# Patient Record
Sex: Male | Born: 1965 | Race: Black or African American | Hispanic: No | Marital: Single | Smoking: Former smoker
Health system: Southern US, Community
[De-identification: ages and names within clinical notes are randomized; demographics above are authoritative.]

## PROBLEM LIST (undated history)

## (undated) DIAGNOSIS — N529 Male erectile dysfunction, unspecified: Secondary | ICD-10-CM

## (undated) DIAGNOSIS — E785 Hyperlipidemia, unspecified: Secondary | ICD-10-CM

## (undated) HISTORY — DX: Hyperlipidemia, unspecified: E78.5

## (undated) HISTORY — DX: Male erectile dysfunction, unspecified: N52.9

## (undated) HISTORY — PX: PROSTATECTOMY: SHX69

---

## 1997-10-20 ENCOUNTER — Encounter: Admission: RE | Admit: 1997-10-20 | Discharge: 1997-10-20 | Payer: Self-pay | Admitting: Family Medicine

## 1997-10-23 ENCOUNTER — Encounter: Admission: RE | Admit: 1997-10-23 | Discharge: 1997-10-23 | Payer: Self-pay | Admitting: Family Medicine

## 2000-10-22 ENCOUNTER — Other Ambulatory Visit: Admission: RE | Admit: 2000-10-22 | Discharge: 2000-10-22 | Payer: Self-pay | Admitting: Family Medicine

## 2000-11-06 ENCOUNTER — Encounter: Payer: Self-pay | Admitting: Family Medicine

## 2000-11-06 ENCOUNTER — Encounter: Admission: RE | Admit: 2000-11-06 | Discharge: 2000-11-06 | Payer: Self-pay | Admitting: Family Medicine

## 2005-05-08 ENCOUNTER — Encounter: Admission: RE | Admit: 2005-05-08 | Discharge: 2005-05-08 | Payer: Self-pay | Admitting: Family Medicine

## 2010-06-07 ENCOUNTER — Encounter: Admission: RE | Admit: 2010-06-07 | Discharge: 2010-06-07 | Payer: Self-pay | Admitting: Family Medicine

## 2014-08-16 DIAGNOSIS — R972 Elevated prostate specific antigen [PSA]: Secondary | ICD-10-CM

## 2014-08-16 HISTORY — DX: Elevated prostate specific antigen (PSA): R97.20

## 2014-10-25 DIAGNOSIS — C61 Malignant neoplasm of prostate: Secondary | ICD-10-CM

## 2014-10-25 HISTORY — DX: Malignant neoplasm of prostate: C61

## 2015-07-30 DIAGNOSIS — N529 Male erectile dysfunction, unspecified: Secondary | ICD-10-CM

## 2015-07-30 HISTORY — DX: Male erectile dysfunction, unspecified: N52.9

## 2017-01-20 DIAGNOSIS — Z86010 Personal history of colonic polyps: Secondary | ICD-10-CM | POA: Diagnosis not present

## 2017-01-20 DIAGNOSIS — K648 Other hemorrhoids: Secondary | ICD-10-CM | POA: Diagnosis not present

## 2017-01-20 DIAGNOSIS — D126 Benign neoplasm of colon, unspecified: Secondary | ICD-10-CM | POA: Diagnosis not present

## 2017-03-10 DIAGNOSIS — H5203 Hypermetropia, bilateral: Secondary | ICD-10-CM | POA: Diagnosis not present

## 2017-06-19 DIAGNOSIS — N289 Disorder of kidney and ureter, unspecified: Secondary | ICD-10-CM | POA: Diagnosis not present

## 2017-06-19 DIAGNOSIS — E7800 Pure hypercholesterolemia, unspecified: Secondary | ICD-10-CM | POA: Diagnosis not present

## 2017-11-28 DIAGNOSIS — R079 Chest pain, unspecified: Secondary | ICD-10-CM | POA: Diagnosis not present

## 2018-03-11 DIAGNOSIS — M9907 Segmental and somatic dysfunction of upper extremity: Secondary | ICD-10-CM | POA: Diagnosis not present

## 2018-03-11 DIAGNOSIS — M9901 Segmental and somatic dysfunction of cervical region: Secondary | ICD-10-CM | POA: Diagnosis not present

## 2018-03-11 DIAGNOSIS — M9902 Segmental and somatic dysfunction of thoracic region: Secondary | ICD-10-CM | POA: Diagnosis not present

## 2018-04-12 DIAGNOSIS — M9902 Segmental and somatic dysfunction of thoracic region: Secondary | ICD-10-CM | POA: Diagnosis not present

## 2018-04-12 DIAGNOSIS — M9901 Segmental and somatic dysfunction of cervical region: Secondary | ICD-10-CM | POA: Diagnosis not present

## 2018-04-12 DIAGNOSIS — M9907 Segmental and somatic dysfunction of upper extremity: Secondary | ICD-10-CM | POA: Diagnosis not present

## 2018-06-21 DIAGNOSIS — Z8546 Personal history of malignant neoplasm of prostate: Secondary | ICD-10-CM | POA: Diagnosis not present

## 2018-06-21 DIAGNOSIS — N289 Disorder of kidney and ureter, unspecified: Secondary | ICD-10-CM | POA: Diagnosis not present

## 2018-06-21 DIAGNOSIS — E7800 Pure hypercholesterolemia, unspecified: Secondary | ICD-10-CM | POA: Diagnosis not present

## 2019-09-16 ENCOUNTER — Encounter: Payer: Self-pay | Admitting: Neurology

## 2019-10-24 ENCOUNTER — Encounter (HOSPITAL_COMMUNITY): Payer: Self-pay

## 2019-10-24 ENCOUNTER — Ambulatory Visit (HOSPITAL_COMMUNITY)
Admission: EM | Admit: 2019-10-24 | Discharge: 2019-10-24 | Disposition: A | Discharge status: Home / Self Care | Payer: 59

## 2019-10-24 ENCOUNTER — Other Ambulatory Visit: Payer: Self-pay

## 2019-10-24 DIAGNOSIS — K5900 Constipation, unspecified: Secondary | ICD-10-CM | POA: Diagnosis not present

## 2019-10-24 DIAGNOSIS — M79622 Pain in left upper arm: Secondary | ICD-10-CM

## 2019-10-24 MED ORDER — POLYETHYLENE GLYCOL 3350 17 GM/SCOOP PO POWD: 17.0000 g | Freq: Every day | ORAL | 0 refills | Status: DC

## 2019-10-24 NOTE — ED Triage Notes (Signed)
Pt is here with left lank pain that started 2 weeks ago, states urinate is dark colored. Pt has taken AZO to relieve discomfort.

## 2019-10-24 NOTE — Discharge Instructions (Addendum)
I do not believe this is you heart. This may be nerve irritation or related to gas trapping  Please try the miralax to ensure you are regularly moving your bowels   You may try over the counter lidocaine patches if having the pain more frequently. Try doing basic stretches, such as reaching over head.  If it becomes much more frequent, you have shortness of breath, chest pain in the middle of your chest or nausea and vomiting with this, please report to the emergency department.

## 2019-10-25 NOTE — ED Provider Notes (Signed)
[facility]    CSN: [id number] Arrival date & time: 10/24/19  1432      History   Chief Complaint Chief Complaint  Patient presents with  . Urinary Tract Infection    HPI Devon Romero is a 54 y.o. male.   Patient presents urgent care for evaluation of left-sided pain.  Symptoms have been present for 1 to 2 weeks.  He points to his upper flank/lower left-sided ribs.  He reports intermittent sharp pain in the left side that is occasionally radiated to the anterior aspect of his left ribs.  This is what made him concerned and prompted him to report to the urgent care.  He was concerned about whether this could be related to his heart.  He reports at most to have 4 instances of sharp pain that lasts 2 to 3 seconds.  He describes it as a" prick" sensation.  Reports occasionally taking a deep breath, position and movements can trigger the pain.  However it is not all that predictable when this will happen.  He denies that exertion creates this pain.  He does not become short of breath with the pain.  He has never become nauseous or diaphoretic with the pain.  He denies any irregular heartbeats or fast heart rate when this occurs.  He denies any skin rashes.  Denies painful urination, frequency urination or urgency.  Denies any blood in his urine.  Denies back pain.  Denies injury to the left side.  He has had no fevers, chills or body aches.  Patient does report occasional bloating on the left side and mild history of constipation.  He has tried some over the counter laxatives and seems to of improved him with his bloating symptoms.  Denies any rectal pain.  He reports his constipation can last up to 2 to 3 days at a time.  Patient does not have a history of high blood pressure but does have a history of high cholesterol and is on statin therapy.  No history of coronary artery disease.     History reviewed. No pertinent past medical history.  There are no problems to display  for this patient.   History reviewed. No pertinent surgical history.     Home Medications    Prior to Admission medications   Medication Sig Start Date End Date Taking? Authorizing Provider  atorvastatin (LIPITOR) 20 MG tablet  06/19/14  Yes [provider]  co-enzyme Q-10 30 MG capsule Take 30 mg by mouth 3 (three) times daily.   Yes [provider]  levofloxacin (LEVAQUIN) 500 MG tablet  11/22/14  Yes [provider]  oxyCODONE-acetaminophen (PERCOCET/ROXICET) 5-325 MG tablet  11/22/14  Yes [provider]  tadalafil (CIALIS) 20 MG tablet Take by mouth. 06/14/15  Yes [provider]  polyethylene glycol powder (GLYCOLAX/MIRALAX) 17 GM/SCOOP powder Take 17 g by mouth daily. 10/24/19   Bruna Dills, Marguerita Beards, PA-C    Family History Family History  Problem Relation Age of Onset  . Diabetes Mother     Social History Social History   Tobacco Use  . Smoking status: Never Smoker  . Smokeless tobacco: Never Used  Substance Use Topics  . Alcohol use: Not Currently  . Drug use: Never     Allergies   Patient has no known allergies.   Review of Systems Review of Systems  Per HPI Physical Exam Triage Vital Signs ED Triage Vitals  Enc Vitals Group     BP 10/24/19 1458  123/76     Pulse Rate 10/24/19 1458 (!) 58     Resp 10/24/19 1458 17     Temp 10/24/19 1458 98.2 F (36.8 C)     Temp Source 10/24/19 1458 Oral     SpO2 10/24/19 1458 100 %     Weight 10/24/19 1455 175 lb (79.4 kg)     Height --      Head Circumference --      Peak Flow --      Pain Score 10/24/19 1455 0     Pain Loc --      Pain Edu? --      Excl. in [place]? --    No data found.  Updated Vital Signs BP 123/76 (BP Location: Right Arm)   Pulse (!) 58   Temp 98.2 F (36.8 C) (Oral)   Resp 17   Wt 175 lb (79.4 kg)   SpO2 100%   Visual Acuity Right Eye Distance:   Left Eye Distance:   Bilateral Distance:    Right Eye Near:   Left Eye Near:    Bilateral  Near:     Physical Exam Vitals and nursing note reviewed.  Constitutional:      General: He is not in acute distress.    Appearance: Normal appearance. He is well-developed and normal weight. He is not ill-appearing or diaphoretic.  HENT:     Head: Normocephalic and atraumatic.  Eyes:     Conjunctiva/sclera: Conjunctivae normal.  Cardiovascular:     Rate and Rhythm: Normal rate and regular rhythm.     Heart sounds: No murmur.  Pulmonary:     Effort: Pulmonary effort is normal. No respiratory distress.     Breath sounds: Normal breath sounds. No wheezing, rhonchi or rales.     Comments: Pain reproduced with deep inspiration. Abdominal:     Palpations: Abdomen is soft.     Tenderness: There is no abdominal tenderness. There is no right CVA tenderness or left CVA tenderness.  Musculoskeletal:     Cervical back: Normal range of motion and neck supple.     Right lower leg: No edema.     Left lower leg: No edema.     Comments: No tenderness to palpation over the area of concern.  Unable to reproduce pain with range of motion of the shoulder and upper body.  Skin:    General: Skin is warm and dry.     Findings: No bruising, erythema or rash.  Neurological:     General: No focal deficit present.     Mental Status: He is alert and oriented to person, place, and time.      UC Treatments / Results  Labs (all labs ordered are listed, but only abnormal results are displayed) Labs Reviewed - No data to display  EKG   Radiology No results found.  Procedures Procedures (including critical care time)  Medications Ordered in UC Medications - No data to display  Initial Impression / Assessment and Plan / UC Course  I have reviewed the triage vital signs and the nursing notes.  Pertinent labs & imaging results that were available during my care of the patient were reviewed by me and considered in my medical decision making (see chart for details).     #Left axillary  pain #Constipation Patient is a 54 year old presenting with left axillary region pain.  Differential for this would include irritation of the long thoracic nerve vs costochondritis vs gas trapping secondary to constipation.  I doubt very seriously this is cardiac in nature as pain is very intermittent, sharp and not related to exertion.  Low suspicion that this is urologic or renal as pain is not in flank and is more in axillary region.  I discussed the likely causes of pain and we will treat his constipation with daily MiraLAX.  Discussed that given the intermittent and sporadic nature that oral medications may not be of long-term benefit.  Discussed that he may benefit from lidocaine patches and basic stretching.  We discussed that if pain became more frequent or intense to return urgent care.  Discussed that it became more prolonged there is accompanied by shortness of breath or radiation around the front of his chest that he should report to the emergency department.  Patient verbalized understanding the plan. Final Clinical Impressions(s) / UC Diagnoses   Final diagnoses:  Left axillary pain  Constipation, unspecified constipation type     Discharge Instructions     I do not believe this is you heart. This may be nerve irritation or related to gas trapping  Please try the miralax to ensure you are regularly moving your bowels   You may try over the counter lidocaine patches if having the pain more frequently. Try doing basic stretches, such as reaching over head.  If it becomes much more frequent, you have shortness of breath, chest pain in the middle of your chest or nausea and vomiting with this, please report to the emergency department.      ED Prescriptions    Medication Sig Dispense Auth. Provider   polyethylene glycol powder (GLYCOLAX/MIRALAX) 17 GM/SCOOP powder Take 17 g by mouth daily. 255 g Jazaria Jarecki, Marguerita Beards, PA-C     PDMP not reviewed this encounter.   Purnell Shoemaker,  PA-C 10/25/19 0800

## 2019-11-23 ENCOUNTER — Other Ambulatory Visit (INDEPENDENT_AMBULATORY_CARE_PROVIDER_SITE_OTHER): Payer: 59

## 2019-11-23 ENCOUNTER — Ambulatory Visit: Payer: 59 | Admitting: Neurology

## 2019-11-23 ENCOUNTER — Other Ambulatory Visit: Payer: Self-pay

## 2019-11-23 ENCOUNTER — Encounter: Payer: Self-pay | Admitting: Neurology

## 2019-11-23 VITALS — BP 123/83 | HR 71 | Resp 18 | Ht 68.0 in | Wt 182.0 lb

## 2019-11-23 DIAGNOSIS — R413 Other amnesia: Secondary | ICD-10-CM

## 2019-11-23 LAB — AMMONIA: Ammonia: 25 umol/L (ref 11–35)

## 2019-11-23 LAB — C-REACTIVE PROTEIN: CRP: <1 mg/dL (ref 0.5–20.0)

## 2019-11-23 LAB — SEDIMENTATION RATE: Sed Rate: 14 mm/hr (ref 0–20)
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
## 2019-11-23 NOTE — Patient Instructions (Addendum)
1. Bloodwork for RPR, ammonia, ESR, CRP, ANA, anti-thyroglobulin antibodies, anti-thyroid peroxidase antibodies  2. Schedule MRI brain with and without contrast  3. Schedule Neurocognitive testing  4. Follow-up after tests, call for any changes   RECOMMENDATIONS FOR ALL PATIENTS WITH MEMORY PROBLEMS: 1. Continue to exercise (Recommend 30 minutes of walking everyday, or 3 hours every week) 2. Increase social interactions - continue going to [place] and enjoy social gatherings with friends and family 3. Eat healthy, avoid fried foods and eat more fruits and vegetables 4. Maintain adequate blood pressure, blood sugar, and blood cholesterol level. Reducing the risk of stroke and cardiovascular disease also helps promoting better memory. 5. Avoid stressful situations. Live a simple life and avoid aggravations. Organize your time and prepare for the next day in anticipation. 6. Sleep well, avoid any interruptions of sleep and avoid any distractions in the bedroom that may interfere with adequate sleep quality 7. Avoid sugar, avoid sweets as there is a strong link between excessive sugar intake, diabetes, and cognitive impairment We discussed the Mediterranean diet, which has been shown to help patients reduce the risk of progressive memory disorders and reduces cardiovascular risk. This includes eating fish, eat fruits and green leafy vegetables, nuts like almonds and hazelnuts, walnuts, and also use olive oil. Avoid fast foods and fried foods as much as possible. Avoid sweets and sugar as sugar use has been linked to worsening of memory function.  We have sent a referral to [facility] for your MRI and they will call you directly to schedule your appointment. They are located at [place]. If you need to contact them directly please call [phone].  Your provider has requested that you have labwork completed today. Please go to [facility] ([id number]) on the second  floor of this building before leaving the office today. You do not need to check in. If you are not called within 15 minutes please check with the front desk.

## 2019-11-25 LAB — THYROGLOBULIN LEVEL: Thyroglobulin: 6.9 ng/mL

## 2019-11-25 LAB — RPR: RPR Ser Ql: NONREACTIVE

## 2019-11-25 LAB — THYROID PEROXIDASE ANTIBODY: Thyroperoxidase Ab SerPl-aCnc: <1 IU/mL (ref <9)

## 2019-11-26 LAB — ANTI-NUCLEAR AB-TITER (ANA TITER): ANA Titer 1: 1:80 {titer} — ABNORMAL HIGH

## 2019-11-26 LAB — ANA: Anti Nuclear Antibody (ANA): POSITIVE — AB

## 2019-11-29 ENCOUNTER — Telehealth: Payer: Self-pay

## 2019-11-29 NOTE — Telephone Encounter (Signed)
-----   Message from Cameron Sprang, MD sent at 11/29/2019  1:55 PM EDT ----- Pls let him know bloodwork overall unremarkable. His ANA level was a little elevated, but not specific for anything. Proceed with memory tests as scheduled. Thanks

## 2019-11-29 NOTE — Telephone Encounter (Signed)
Pt called no answer voice mail left for pt to call back 

## 2019-11-30 ENCOUNTER — Telehealth: Payer: Self-pay

## 2019-11-30 NOTE — Telephone Encounter (Signed)
-----   Message from Cameron Sprang, MD sent at 11/29/2019  1:55 PM EDT ----- Pls let him know bloodwork overall unremarkable. His ANA level was a little elevated, but not specific for anything. Proceed with memory tests as scheduled. Thanks

## 2019-11-30 NOTE — Telephone Encounter (Signed)
Spoke with pt informed him of lab results, pt will be in the office tomorrow for his memory testing ,

## 2019-12-01 ENCOUNTER — Encounter: Payer: Self-pay | Admitting: Counselor

## 2019-12-01 ENCOUNTER — Ambulatory Visit (INDEPENDENT_AMBULATORY_CARE_PROVIDER_SITE_OTHER): Payer: 59 | Admitting: Counselor

## 2019-12-01 ENCOUNTER — Other Ambulatory Visit: Payer: Self-pay

## 2019-12-01 ENCOUNTER — Ambulatory Visit: Payer: 59

## 2019-12-01 DIAGNOSIS — R413 Other amnesia: Secondary | ICD-10-CM

## 2019-12-01 DIAGNOSIS — F09 Unspecified mental disorder due to known physiological condition: Secondary | ICD-10-CM | POA: Diagnosis not present

## 2019-12-01 NOTE — Progress Notes (Signed)
   Psychometrist Note   Cognitive testing was administered to Devon Romero by Lamar Benes, B.S. (Technician) under the supervision of Alphonzo Severance, Psy.D., ABN. [name] was able to tolerate all test procedures. Dr. Nicole Kindred met with the patient as needed to manage any emotional reactions to the testing procedures (if applicable). Rest breaks were offered.    The battery of tests administered was selected by Dr. Nicole Kindred with consideration to the patient's current level of functioning, the nature of his symptoms, emotional and behavioral responses during the interview, level of literacy, observed level of motivation/effort, and the nature of the referral question. This battery was communicated to the psychometrist. Communication between Dr. Nicole Kindred and the psychometrist was ongoing throughout the evaluation and Dr. Nicole Kindred was immediately accessible at all times. Dr. Nicole Kindred provided supervision to the technician on the date of this service, to the extent necessary to assure the quality of all services provided.    [name] will return in approximately one week for an interactive feedback session with Dr. Nicole Kindred, at which time male test performance, clinical impressions, and treatment recommendations will be reviewed in detail. The patient understands he can contact our office should he require our assistance before this time.   A total of 140 minutes of billable time were spent with Devon Romero by the technician, including test administration and scoring time. Billing for these services is reflected in Dr. Les Pou note.   This note reflects time spent with the psychometrician and does not include test scores, clinical history, or any interpretations made by Dr. Nicole Kindred. The full report will follow in a separate note.

## 2019-12-01 NOTE — Progress Notes (Deleted)
NEUROPSYCHOLOGICAL EVALUATION [facility]  Patient Name: Devon Romero MRN: [id number] Date of Birth: 10/15/1965 Age: 54 y.o. Education: 9 years  Referral Circumstances and Background Information  Devon Romero is a 54 y.o., right-hand dominant, engaged man with a history of hyperlipidemia, prostate cancer, and memory loss. He was referred for neuropsychological testing by Dr. Karel Jarvis, who demonstrated a SLUMS of 11/30.   On interview, the patient himself reported that he has always had a keen memory, and he feels as though he has been having a harder time with memory since about 2018. The company he was working for closed in 2016, he was out of work until 2018, and then he had a hard time learning a new job in 2018, he was at [employer]. He stated that he was repeating himself and asking the same things over again. He previously reported that his coworkers noticed his problems but he is denying that today. He said he was still able to function at his job. He was furloughed in May, 2020 and hasn't been back to work since. His fiance said that there is rapid forgetting of information, at this point in time, over the past year. He forgets that he has scheduled appointments. He doesn't forget entire events.  The patient says he thinks his problems have gotten better over the past month but his fiancee thinks they are worse. She notices some problems keeping track of the specific date but he doesn't have problems keeping track of the month or the year. With respect to sleep, the patient has a disturbed sleep schedule, he often stays up until early in the morning and then sleeps until the early afternoon. The patient says he thinks he is getting a decent 8 hours of sleep but his fiance does not and thinks that sometimes, he is only getting a few hours. He denied any changes with appetite. He stated that his energy is fine.   With respect to functioning, his fiance said that he has had  some problems apart from work. 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 He also got turned around in a neighborhood with which he was familiar about a year ago. She said that he is forgetting passwords for his computer. The patient does some basic cooking and stated that he is doing fine with cooking. He left the burner on the stove on once but it was an isolated incident. He is able to use the community as needed to get groceries and things of that nature but he needs a list. He was apparently functioning adequately at his job until he was furloughed, although his fiance doesn't think that he could do a job now. He has not considered applying for disability because he would like to go back to work after his memory problems are treated.   Past Medical History and Review of Relevant Studies  There are no problems to display for this patient.  Review of Neuroimaging and Relevant Medical History: :  MRI of the brain has been ordered but has not yet been obtained  Nonfocal neurological exam with Dr. Karel Jarvis 11/23/2019.  Patient has a history of head injury around age 37, he was tubing, and he was admitted to the hospital for one night for observation. He stated that he made a full recovery cognitively.    Current Outpatient Medications  Medication Sig Dispense Refill  .  atorvastatin (LIPITOR) 40 MG tablet Take 40 mg by mouth daily.    Marland Kitchen co-enzyme Q-10 30 MG capsule Take 30 mg by mouth 3 (three) times daily.    . tadalafil (CIALIS) 20 MG tablet Take by mouth.     No current facility-administered medications for this visit.    Family History  Problem Relation Age of Onset  . Diabetes Mother    There is no  family history of dementia. The patient has two brothers and four sisters who are all over 68 and he denied they have any memory and thinking problems. His parents are in their early 9s and have no memory and thinking  problems. There is no  family history of psychiatric illness.  Psychosocial History  Developmental, Educational and Employment History: The patient is a native of [place] and has lived in [place] since 1996. The patient stated that he was a "decent" student, but on closer discussion, he was held back one year and failed classes occasionally. He said he did well in elementary school and had a harder time when he went to junior high. He said the junior high school that he went to was across town, white, and the classes were really challenging. He wasn't clear if he had any specific problems in different areas but he is not good at spelling. He left in the 10th grade, partly because he had fallen behind. He also had a child on the way. 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 He said that it did involve a fair amount of math and knowledge of the machinery.   Psychiatric History: Denied by the patient.   Substance Use History: The patient used to drink excessively although he stopped in 1992. He smoked for many years but quit about 10 years ago. He doesn't use cannabis or illicit drugs.   Relationship History and Living Cimcumstances: The patient and his fiance Devon Romero have been together 30 plus years. They are engaged. They have no children, the patient has a child from a previous relationship. She lives in [place] and he talks to her frequently. His fiance said that his daughter has noticed his memory and thinking problems, as has his mother.   Mental Status and Behavioral Observations  Sensorium/Arousal: The patient's level of arousal was awake and alert. Hearing and vision were adequate for testing purposes. Orientation: The patient was alert and oriented to person, place, time, and situation.  Appearance: The  patient was casually dressed in appropriate, casual clothing with good grooming and hygiene.  Behavior: The patient was pleasant and appropriate. In general, he seemed to portray his difficulties as less severe than his fiancee. He appeared to outwardly be putting forth good effort and to be actively engaged by the assessment tasks.  Speech/language: Speech was normal in rate, rhythm, volume, and prosody. There were no word finding pauses or paraphasic errors.  Gait/Posture: Gait was not well observed.  Movement: There were no overt signs/symptoms of movement disorder such as bradykinesia, hypokinesia, or adventitious movements. Social Comportment: Pleasant, appropriate Mood: "Good"  Affect: Euthymic Thought process/content: The patient's thought process was logical, linear, and goal oriented for the most part. Thought content was appropriate to the topics discussed.  Safety: The patient denied any thoughts of harming himself or others.  Insight: Unclear, at least mildly diminished  Test Procedures  Wide Range Achievement Test - 4   Word Reading Wechsler Adult Intelligence Scale - IV  Digit Span  Arithmetic  Symbol Search  Coding Repeatable Battery for the Assessment of Neuropsychological Status (Form A) ACS Word Choice The Dot Counting Test Green's MSVT Controlled Oral Word Association (F-A-S) Semantic Fluency (Animals) Trail Making Test A & B Wisconsin Card Sorting Test [phone] Patient Health Questionnaire - 9  GAD-7  Plan  Devon Romero was seen for a psychiatric diagnostic evaluation and neuropsychological testing. He has noticed memory problems over the past 3 years and his fiancee has become more concerned over the past year. He was still working until May, 2020 when he was furloughed and has had more problems since then. His fiancee reports that he is now rapidly forgetting things and he has gotten lost a few times. MRI of the brain is pending. He is screening in the mild  dementia range, although potential validity issues and other factors will be better evaluated by neuropsychological testing and he may have a learning issue given his clinical history. Full and complete note with impressions, recommendations, and interpretation of test data to follow.   Bettye Boeck Roseanne Reno, PsyD, ABN Clinical Neuropsychologist  Informed Consent and Coding/Compliance  Risks and benefits of the evaluation were discussed with the patient prior to all testing procedures. I conducted a clinical interview {psqhptesting:23835} with Ricke Hey and {pstechnician:23646} assisted me in administering additional test procedures. The patient was able to tolerate the testing procedures and the patient (and/or family if applicable) is likely to benefit from further follow up to receive the diagnosis and treatment recommendations, which will be rendered at the next encounter. Billing below reflects technician time, my direct face-to-face time with the patient, time spent in test administration, and time spent in professional activities including but not limited to: neuropsychological test interpretation, integration of neuropsychological test data with clinical history, report preparation, treatment planning, care coordination, and review of diagnostically pertinent medical history or studies.   Services associated with this encounter: Clinical Interview [phone]) plus 60 minutes (45409; Neuropsychological Evaluation by Professional)  120 minutes (81191; Neuropsychological Evaluation by Professional, Adl.) 30 minutes (47829; Test Administration by Professional) 110 minutes [phone]; Test Administration by Professional, Adl.) *** minutes (08657; Neuropsychological Testing by Technician) *** minutes (84696; Neuropsychological Testing by Technician, Adl.)

## 2019-12-02 ENCOUNTER — Ambulatory Visit
Admission: RE | Admit: 2019-12-02 | Discharge: 2019-12-02 | Disposition: A | Discharge status: Home / Self Care | Payer: 59 | Source: Ambulatory Visit | Attending: Neurology | Admitting: Neurology

## 2019-12-02 DIAGNOSIS — R413 Other amnesia: Secondary | ICD-10-CM

## 2019-12-02 MED ORDER — GADOBENATE DIMEGLUMINE 529 MG/ML IV SOLN
16.0000 mL | Freq: Once | INTRAVENOUS | Status: AC | PRN
  Administered 2019-12-02: 16 mL via INTRAVENOUS

## 2019-12-02 NOTE — Progress Notes (Signed)
NEUROPSYCHOLOGICAL EVALUATION [facility]  Patient Name: Devon Romero MRN: [id number] Date of Birth: 02-Dec-1965 Age: 54 y.o. Education: 9 years  Referral Circumstances and Background Information  Mr. Dilullo is a 54 y.o., right-hand dominant, engaged man with a history of hyperlipidemia, prostate cancer, and memory loss. He was referred for neuropsychological testing by Dr. Karel Jarvis, who demonstrated a SLUMS of 11/30 and a non-focal exam.   On interview, the patient himself reported that he has always had a keen memory, and he feels as though he has been having a harder time with memory since about 2018. The company he was working for closed in 2016, he was out of work until 2018, and then he had a hard time learning a new job in 2018, he was at [employer]. He stated that he was repeating himself and asking the same things over again. He previously reported that his coworkers noticed his problems but he is denying that today. He said he was still able to function at his job. He was furloughed in May, 2020 and hasn't been back to work since. His fiance said that there is rapid forgetting of information, at this point in time, over the past year. He forgets that he has scheduled appointments. He doesn't forget entire events.  The patient says he thinks his problems have gotten better over the past month but his fiancee thinks they are worse. She notices some problems keeping track of the specific date but he doesn't have problems keeping track of the month or the year. With respect to sleep, the patient has a disturbed sleep schedule, he often stays up until early in the morning and then sleeps until the early afternoon. The patient says he thinks he is getting a decent 8 hours of sleep but his fiance does not and thinks that sometimes, he is only getting a few hours. He denied any changes with appetite. He stated that his energy is fine.   With respect to functioning, his fiance said that  he has had some problems apart from work. 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 He also got turned around in a neighborhood with which he was familiar about a year ago. She said that he is forgetting passwords for his computer. The patient does some basic cooking and stated that he is doing fine with cooking. He left the burner on the stove on once but it was an isolated incident. He is able to use the community as needed to get groceries and things of that nature but he needs a list. He was apparently functioning adequately at his job until he was furloughed, although his fiance doesn't think that he could do a job now. He has not considered applying for disability because he would like to go back to work after his memory problems are treated.   Past Medical History and Review of Relevant Studies  There are no problems to display for this patient.  Review of Neuroimaging and Relevant Medical History: :  MRI of the brain has been ordered but has not yet been obtained  Nonfocal neurological exam with Dr. Karel Jarvis 11/23/2019.  Patient has a history of head injury around age 57, he was tubing, and he was admitted to the hospital for one night for observation. He stated that he made a full recovery cognitively.    Current Outpatient Medications  Medication Sig Dispense Refill  .  atorvastatin (LIPITOR) 40 MG tablet Take 40 mg by mouth daily.    Marland Kitchen co-enzyme Q-10 30 MG capsule Take 30 mg by mouth 3 (three) times daily.    . tadalafil (CIALIS) 20 MG tablet Take by mouth.     No current facility-administered medications for this visit.    Family History  Problem Relation Age of Onset  . Diabetes Mother    There is no  family history of dementia. The patient has two brothers and four sisters who are all over 65 and he denied they have any memory and thinking problems. His parents are in their early 45s and have no memory and  thinking problems. There is no  family history of psychiatric illness.  Psychosocial History  Developmental, Educational and Employment History: The patient is a native of [place] and has lived in [place] since 1996. The patient stated that he was a "decent" student, but on closer discussion, he was held back one year and failed classes occasionally. He said he did well in elementary school and had a harder time when he went to junior high. He said the junior high school that he went to was across town, white, and the classes were really challenging. He wasn't clear if he had any specific problems in different areas but he is not good at spelling. He left in the 10th grade, partly because he had fallen behind. He also had a child on the way. 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 He said that it did involve a fair amount of math and knowledge of the machinery.   Psychiatric History: Denied by the patient.   Substance Use History: The patient used to drink excessively although he stopped in 1992. He smoked for many years but quit about 10 years ago. He doesn't use cannabis or illicit drugs.   Relationship History and Living Cimcumstances: The patient and his fiance [name] have been together 30 plus years. They are engaged. They have no children, the patient has a child from a previous relationship. She lives in [place] and he talks to her frequently. His fiance said that his daughter has noticed his memory and thinking problems, as has his mother.   Mental Status and Behavioral Observations  Sensorium/Arousal: The patient's level of arousal was awake and alert. Hearing and vision were adequate for testing purposes. Orientation: The patient was alert and oriented to person, place, time, and situation.  Appearance:  The patient was casually dressed in appropriate, casual clothing with good grooming and hygiene.  Behavior: The patient was pleasant and appropriate. In general, he seemed to portray his difficulties as less severe than his fiancee. He appeared to outwardly be putting forth good effort and to be actively engaged by the assessment tasks.  Speech/language: Speech was normal in rate, rhythm, volume, and prosody. There were no word finding pauses or paraphasic errors.  Gait/Posture: Gait was not well observed.  Movement: There were no overt signs/symptoms of movement disorder such as bradykinesia, hypokinesia, or adventitious movements. Social Comportment: Pleasant, appropriate Mood: "Good"  Affect: Euthymic Thought process/content: The patient's thought process was logical, linear, and goal oriented for the most part. Thought content was appropriate to the topics discussed.  Safety: The patient denied any thoughts of harming himself or others.  Insight: Unclear, at least mildly diminished  MMSE - Mini Mental State Exam 12/02/2019  Orientation to time 5  Orientation to Place 5  Registration 3  Attention/ Calculation 4  Recall 0  Language- name 2 objects 2  Language- repeat 0  Language- follow 3 step command 3  Language- read & follow direction 1  Write a sentence 0  Copy design 1  Total score 24   Test Procedures  Wide Range Achievement Test - 4   Word Reading Wechsler Adult Intelligence Scale - IV  Digit Span  Arithmetic  Symbol Search  Coding Repeatable Battery for the Assessment of Neuropsychological Status (Form A) ACS Word Choice The Dot Counting Test Green's MSVT Controlled Oral Word Association (F-A-S) Semantic Fluency (Animals) Trail Making Test A & B Wisconsin Card Sorting Test [phone] Patient Health Questionnaire - 9  GAD-7  Plan  BENI KUBAS was seen for a psychiatric diagnostic evaluation and neuropsychological testing. He has noticed memory problems over  the past 3 years and his fiancee has become more concerned over the past year. He was still working until May, 2020 when he was furloughed and has had more problems since then. His fiancee reports that he is now rapidly forgetting things and he has gotten lost a few times. MRI of the brain is pending. He is screening in the mild dementia range, although potential validity issues and other factors will be better evaluated by neuropsychological testing and he may have a learning issue given his clinical history. Full and complete note with impressions, recommendations, and interpretation of test data to follow.   Bettye Boeck Roseanne Reno, PsyD, ABN Clinical Neuropsychologist  Informed Consent and Coding/Compliance  Risks and benefits of the evaluation were discussed with the patient prior to all testing procedures. I conducted a clinical interview and neuropsychological testing (at least two tests) with Ricke Hey and Clare Charon, B.S. (Technician) assisted me in administering additional test procedures. The patient was able to tolerate the testing procedures and the patient (and/or family if applicable) is likely to benefit from further follow up to receive the diagnosis and treatment recommendations, which will be rendered at the next encounter. Billing below reflects technician time, my direct face-to-face time with the patient, time spent in test administration, and time spent in professional activities including but not limited to: neuropsychological test interpretation, integration of neuropsychological test data with clinical history, report preparation, treatment planning, care coordination, and review of diagnostically pertinent medical history or studies.   Services associated with this encounter: Clinical Interview [phone]) plus 60 minutes (45409; Neuropsychological Evaluation by Professional)  120 minutes (81191; Neuropsychological Evaluation by Professional, Adl.) 30 minutes (47829;  Test Administration by Professional) 30 minutes (56213; Neuropsychological Testing by Technician) 110 minutes (08657; Neuropsychological Testing by Technician, Adl.)

## 2019-12-06 ENCOUNTER — Telehealth: Payer: Self-pay

## 2019-12-06 ENCOUNTER — Telehealth: Payer: Self-pay | Admitting: Neurology

## 2019-12-06 NOTE — Telephone Encounter (Signed)
Pt called and informed that MRI brain did not show any evidence of tumor, stroke, or bleed. It showed mild age-related changes. Proceed with Neurocognitive testing. Pt verbalized understanding

## 2019-12-06 NOTE — Telephone Encounter (Signed)
-----   Message from Cameron Sprang, MD sent at 12/06/2019 10:27 AM EDT ----- Pls let patient know MRI brain did not show any evidence of tumor, stroke, or bleed. It showed mild age-related changes. Proceed with Neurocognitive testing, thanks

## 2019-12-06 NOTE — Telephone Encounter (Signed)
Pt called back informed that we can not text results to him, and reminded that he was called earlier about MRI results not lab results pt verbalized understanding

## 2019-12-06 NOTE — Telephone Encounter (Signed)
Patient left message with [employer]:  "Caller states he is returning a call about his lab results he received this morning. He is requesting that his lab results be texted to him."

## 2019-12-07 NOTE — Progress Notes (Signed)
NEUROPSYCHOLOGICAL TEST SCORES [facility]  Patient Name: Devon Romero MRN: [id number] Date of Birth: [birth year] Age: 54 y.o. Education: 9 years  Measurement properties of test scores: IQ, Index, and Standard Scores (SS): Mean = 100; Standard Deviation = 15 Scaled Scores (Ss): Mean = 10; Standard Deviation = 3 Z scores (Z): Mean = 0; Standard Deviation = 1 T scores (T); Mean = 50; Standard Deviation = 10  TEST SCORES:    Note: This summary of test scores accompanies the interpretive report and should not be interpreted by unqualified individuals or in isolation without reference to the report. Test scores are relative to age, gender, and educational history as available and appropriate.   Performance Validity        ACS: Raw Descriptor      Word Choice: 35 Below Expectation      MSVT: Raw Descriptor      Immediate Recall 75 Below Expectation      Delayed Recall 85 Below Expectation      Consistency 60 Below Expectation      Paired Associates 60 Below Expectation      Free Recall 20 Below Expectation      The Dot Counting Test: Raw Descriptor      E-Score 14 Within Expectation      Embedded Measures: Raw Descriptor      RBANS Effort Index: 4 Below Expectation      WAIS-IV Reliable Digit Span: 10 Within Expectation      WAIS-IV Reliable Digit Span Revised 14 Within Expectation      Expected Functioning        Wide Range Achievement Test (Word Reading): Standard/Scaled Score Percentile       Word Reading 76 5      Cognitive Testing        RBANS, Form : Standard/Scaled Score Percentile  Total Score 61 <1  Immediate Memory 57 <1      List Learning 4 2      Story Memory 2 <1  Visuospatial/Constructional 69 2      Figure Copy   (15) 5 5      Line Orientation --- 3-9  Language 90 25      Picture Naming --- 51-75      Semantic Fluency 7 16  Attention 88 21      Digit Span 12 75      Coding 4 2  Delayed Memory 40 <1      List Recall   (0) --- <2       List Recognition   (12) --- <2      Story Recall   (0) 1 <1      Figure Recall   (0) 1 <1      Wechsler Adult Intelligence Scale - IV: Standard/Scaled Score Percentile  Working Memory Index 92 30      Digit Span 10 50          Digit Span Forward 12 75          Digit Span Backward 10 50          Digit Span Sequencing 7 16      Arithmetic 7 16  Processing Speed Index 65 1      Symbol Search 3 1      Coding 4 2      Neuropsychological Assessment Battery (Language Module): T-score Percentile      Naming 55 69      Verbal Fluency: T-score  Percentile      Controlled Oral Word Association (F-A-S) 58 79      Semantic Fluency (Animals) 50 50      Trail Making Test: T-Score Percentile      Part A 37 9      Part B 43 25      Modified [place] Card Sorting Test (MWCST): Standard/T-Score Percentile      Number of Categories Correct 31 3      Number of Perseverative Errors 28 2      Number of Total Errors 26 1      Percent Perseverative Errors 39 14  Executive Function Composite 66 2      Boston Diagnostic Aphasia Exam: Raw Score Scaled Score      Complex Ideational Material 11 9      Clock Drawing Raw Score Descriptor      Command 7 Mild Impairment      Rating Scales         Raw Score Descriptor  Patient Health Questionnaire - 9 5 Mild Depression  GAD-7 10 Moderate Anxiety  Quick Dementia Rating System        Sum of Boxes 5 Mild Dementia      Total Score 6.5 Mild Dementia    Reni Hausner V. Roseanne Reno PsyD, ABN Clinical Neuropsychologist

## 2019-12-08 ENCOUNTER — Ambulatory Visit (INDEPENDENT_AMBULATORY_CARE_PROVIDER_SITE_OTHER): Payer: 59 | Admitting: Counselor

## 2019-12-08 ENCOUNTER — Encounter: Payer: Self-pay | Admitting: Counselor

## 2019-12-08 ENCOUNTER — Other Ambulatory Visit: Payer: Self-pay

## 2019-12-08 DIAGNOSIS — F0280 Dementia in other diseases classified elsewhere without behavioral disturbance: Secondary | ICD-10-CM | POA: Diagnosis not present

## 2019-12-08 DIAGNOSIS — G300 Alzheimer's disease with early onset: Secondary | ICD-10-CM | POA: Diagnosis not present

## 2019-12-08 HISTORY — DX: Dementia in other diseases classified elsewhere, unspecified severity, without behavioral disturbance, psychotic disturbance, mood disturbance, and anxiety: F02.80

## 2019-12-08 NOTE — Patient Instructions (Signed)
Your performance and presentation on neuropsychological assessment were consistent with memory storage problems, diminished executive abilities, and some additional difficulties with visuospatial and constructional functioning (I.e., perceiving things clearly and constructing things, such as drawings). In the context of your clinical history and wife's report of your functioning, I think these findings are highly concerning for a dementia. I also believe there was more shrinkage than we would expect in parts of your brain responsible for memory, which is supportive of that impression.   Dementia refers to a group of syndromes where multiple areas of ability are damaged in the brain, such as memory, thinking, judgment, and behavior, and most commonly refers to age related causes of dementia that cause worsening in these abilities over time. Alzheimer's disease is the most common form of dementia in people over the age of 40. Not all dementias are Alzheimer's disease, but all Alzheimer's disease is dementia. When dementia is due to an underlying condition affecting the brain, such as Alzheimer's disease, there is progression over time, which typically procedes gradually over many years.   In your case, your presentation, neuroimaging, and neuropsychological test data are concerning for early onset Alzheimer's disease. This is a rare condition in young individuals and as such, it would not be unreasonable to pursue further biomarker confirmation.    CSF biomarkers for Alzheimer's disease involve taking a sample of cerebrospinal fluid through a lumbar puncture (what is also colloquially called a "spinal tap"). This study can detect if there are proteins in your CSF that have been shown to be predictive of Alzheimer's disease. If that study were positive, it would greatly enhance our confidence that this is Alzheimer's disease. If those findings are negative, it would not rule out the possibility of dementia but  would mean that something other than Alzheimer's disease is likely the cause of your problem. If your difficulties are due to Alzheimer's disease, they will worsen over time.   I think that your test findings are supportive of disability status and within a reasonable degree of neuropsychological certainty, I think you would have a hard time attaining and maintaining gainful employment at the present. You may wish to file for disability.   There are medications that can be helpful for memory loss, which you can discuss with Dr. Delice Lesch.   It is unclear from your neuropsychological test data if you are unsafe to drive at the present time. I would encourage you to carefully monitor your driving, because you have gotten lost on several occasions. I would encourage you to use a GPS, drive with family members, and I think it is inadvisable for you to drive very long distances (such as interstate driving) and you may also want to avoid driving in unfamiliar areas.   There is now good quality evidence from at least one large scale study that a modified mediterranean diet may help slow cognitive decline. This is known as the "MIND" diet. The Mind diet is not so much a specific diet as it is a set of recommendations for things that you should and should not eat.   Foods that are ENCOURAGED on the MIND Diet:  Green, leafy vegetables: Aim for six or more servings per week. This includes kale, spinach, cooked greens and salads.  All other vegetables: Try to eat another vegetable in addition to the green leafy vegetables at least once a day. It is best to choose non-starchy vegetables because they have a lot of nutrients with a low number of calories.  Berries: Eat berries at least twice a week. There is a plethora of research on strawberries, and other berries such as blueberries, raspberries and blackberries have also been found to have antioxidant and brain health benefits.  Nuts: Try to get five servings of  nuts or more each week. The creators of the [place] don't specify what kind of nuts to consume, but it is probably best to vary the type of nuts you eat to obtain a variety of nutrients. Peanuts are a legume and do not fall into this category.  Olive oil: Use olive oil as your main cooking oil. There may be other heart-healthy alternatives such as algae oil, though there is not yet sufficient research upon which to base a formal recommendation.  Whole grains: Aim for at least three servings daily. Choose minimally processed grains like oatmeal, quinoa, brown rice, whole-wheat pasta and 100% whole-wheat bread.  Fish: Eat fish at least once a week. It is best to choose fatty fish like salmon, sardines, trout, tuna and mackerel for their high amounts of omega-3 fatty acids.  Beans: Include beans in at least four meals every week. This includes all beans, lentils and soybeans.  Poultry: Try to eat chicken or [place] at least twice a week. Note that fried chicken is not encouraged on the MIND diet.  Wine: Aim for no more than one glass of alcohol daily. Both red and white wine may benefit the brain. However, much research has focused on the red wine compound resveratrol, which may help protect against Alzheimer's disease.  Foods that are DISCOURAGED on the MIND Diet: Butter and margarine: Try to eat less than 1 tablespoon (about 14 grams) daily. Instead, try using olive oil as your primary cooking fat, and dipping your bread in olive oil with herbs.  Cheese: The MIND diet recommends limiting your cheese consumption to less than once per week.  Red meat: Aim for no more than three servings each week. This includes all beef, pork, lamb and products made from these meats.  [place] food: The MIND diet highly discourages fried food, especially the kind from fast-food restaurants. Limit your consumption to less than once per week.  Pastries and sweets: This includes most of the processed junk food and desserts you  can think of. Ice cream, cookies, brownies, snack cakes, donuts, candy and more. Try to limit these to no more than four times a week.  Exercise is one of the best medicines for promoting health and maintaining cognitive fitness at all stages in life. Exercise probably has the largest documented effect on brain health and performance of any intervention. Studies have shown that even previously sedentary individuals who start exercising as late as age 4 show a significant survival benefit as compared to their non-exercising peers. In the [place], the current guidelines are for 30 minutes of moderate exercise per day, but increasing your activity level less than that may also be helpful. You do not have to get your 30 minutes of exercise in one shot and exercising for short periods of time spread throughout the day can be helpful. Go for several walks, learn to dance, or do something else you enjoy that gets your body moving. Of course, if you have an underlying medical condition or there is any question about whether it is safe for you to exercise, you should consult a medical treatment provider prior to beginning exercise.

## 2019-12-08 NOTE — Progress Notes (Signed)
NEUROPSYCHOLOGICAL EVALUATION [facility]  Patient Name: Devon Romero MRN: [id number] Date of Birth: 12/29/1965 Age: 54 y.o. Education: 9 years  Clinical Impressions  Devon Romero is a 54 y.o., right-hand dominant, engaged man with a history of hyperlipidemia, prostate cancer, and memory loss over the past 2 to 3 years. He has been out of work since May, 2020, and his partner does not think he could go back. They have noticed forgetfulness, difficulties tracking appointments, and some minor difficulties with orientation. He recently had an MRI since his past appointment, which to my eye shows fairly significant volume loss in the mesial temporal areas (particularly toward the head of the hippocampus) considering his relatively young age.   Neuropsychological test findings are concerning for significant cognitive impairment including memory storage problems, some level of executive dysfunction, and visuospatial/constructional problems. His difficulty interacting with visual test stimuli likely undermined his performance on processing speed tasks.  He performed adequately on measures of verbal fluency and working memory. He was rated as functioning at a mild dementia level by his partner, [name]. He screened positive for the presence of mild depression and moderate anxiety symptoms although he presented as largely euthymic and denied much in the way of problems on interview so that may reflect difficulties interacting with the test forms as opposed to a bona-fide psychiatric condition.   The presentation is highly concerning for early onset dementia, which would most likely be due to Alzheimer's disease given his presentation and test performance. The findings are supportive of disability status. Given his young age, confirmation with biomarkers would be reasonable but may not be needed as his imaging is also suggestive.   Diagnostic Impressions: Early onset Alzheimer's  disease  Recommendations to be discussed with patient  Your performance and presentation on neuropsychological assessment were consistent with memory storage problems, diminished executive abilities, and some additional difficulties with visuospatial and constructional functioning (I.e., perceiving things clearly and constructing things, such as drawings). In the context of your clinical history and wife's report of your functioning, I think these findings are highly concerning for a dementia. I also believe there was more shrinkage than we would expect in parts of your brain responsible for memory, which is supportive of that impression.   Dementia refers to a group of syndromes where multiple areas of ability are damaged in the brain, such as memory, thinking, judgment, and behavior, and most commonly refers to age related causes of dementia that cause worsening in these abilities over time. Alzheimer's disease is the most common form of dementia in people over the age of 55. Not all dementias are Alzheimer's disease, but all Alzheimer's disease is dementia. When dementia is due to an underlying condition affecting the brain, such as Alzheimer's disease, there is progression over time, which typically procedes gradually over many years.   In your case, your presentation, neuroimaging, and neuropsychological test data are concerning for early onset Alzheimer's disease. This is a rare condition in young individuals and as such, it would not be unreasonable to pursue further biomarker confirmation. Because I do think the shrinkage in parts of your brain responsible for memory is out of proportion to what we would expect and that is consistent with your neuropsychological test findings, this is more optional than necessary for your workup.    CSF biomarkers for Alzheimer's disease involve taking a sample of cerebrospinal fluid through a lumbar puncture (what is also colloquially called a "spinal tap"). This  study can detect  if there are proteins in your CSF that have been shown to be predictive of Alzheimer's disease. If that study were positive, it would greatly enhance our confidence that this is Alzheimer's disease. If those findings are negative, it would not rule out the possibility of dementia but would mean that something other than Alzheimer's disease is likely the cause of your problem. If your difficulties are due to Alzheimer's disease, they will worsen over time.   I think that your test findings are supportive of disability status and within a reasonable degree of neuropsychological certainty, I think you would have a hard time attaining and maintaining gainful employment at the present. You may wish to file for disability.   There are medications that can be helpful for memory loss, which you can discuss with Dr. Karel Jarvis.   It is unclear from your neuropsychological test data if you are unsafe to drive at the present time. I would encourage you to carefully monitor your driving, because you have gotten lost on several occasions. I would encourage you to use a GPS, drive with family members, and I think it is inadvisable for you to drive very long distances (such as interstate driving) and you may also want to avoid driving in unfamiliar areas.   Test Findings  Test scores are summarized in additional documentation associated with this encounter. Test scores are relative to age, gender, and educational history as available and appropriate. Mr.Uffelman performed below expectations on several standalone measures of performance validity; however, most of the tests he did poorly on are memory dependent. He performed within normal limits on non-memory dependent measures. It is therefore my opinion that these findings represent significant cognitive impairment as opposed to a validity problem per se and that the test data may be interpreted as accurate reflections of Mr. Madeira underlying level of  ability.   General Intellectual Functioning/Achievement:  Performance was unusually low on single word reading, which may reflect Mr. Odonald history of learning difficulties and limited academic enrichment and tempers expectations for his cognitive test performance to some extent.   Attention and Processing Efficiency: Performance fell at an average level on the Working Memory Index from the WAIS-IV. Adequate average range scores were demonstrated for digit repetition forward and backward. Digit resequencing in ascending order and mental solving of arithmetical word problems without paper and pencil were low average.   Performance on processing speed measures fell at an extremely low level but may have been undermined by difficulties interacting with visual test forms. Extremely low scores were obtained on two different measures of timed number-symbol coding and on a symbol matching to sample test.   Language: Performance on language measures was good with normal range visual object confrontation naming, high average phonemic fluency, and average to low average semantic fluency.   Visuospatial Function: Performance on visuospatial and constructional measures was below expectations and fell at an extremely low level on the overall index. Unusually low performance was demonstrated on copy of a line drawing and judgment of angular line orientations.   Learning and Memory: Learning and memory measures reflected very minimal acquisition of information and deficient retention of information across time, a profile concerning for storage problems.   In the verbal realm, immediate recall for material including a 10-item word list and brief short story was extremely low followed by no information recalled following a standard delay, which is impaired. Yes/no recognition discriminability for the words from the list fell near chance levels.   In the visual  realm, delayed recall for a modestly complex  figure was extremely low (he did not remember any of the figure details).   Executive Functions: Performance on executive measures was mixed, suggesting some issues. He achieved an extremely low score on the Executive Function Composite of the Modified [employer] with scores for categories completed and perseverative errors hovering around the extremely low range. Alternating sequencing of numbers and letters of the alphabet was better and fell at a low average level. Reasoning with verbal information was average and generation of words in response to the letters F-A-S was high average. He did have difficulty on clock drawing, which was consistent with "mild impairment" even after he was given a chance to restart due to poor planning the first time around.   Rating Scale(s): Mr. Bahn screened positive for the presence of mild depression and moderate anxiety but given his presentation and responses on interview, I think that may be a false positive and will discuss it with him. He was characterized as functioning at a mild dementia level by his fiancee [name].   Bettye Boeck Roseanne Reno PsyD, ABN Clinical Neuropsychologist

## 2019-12-08 NOTE — Progress Notes (Addendum)
   [facility]  I met with Devon Romero to review the findings resulting from his neuropsychological evaluation. Since the last appointment, he has been about the same. He continues to have memory issues. Time was spent reviewing the impressions and recommendations that are detailed in the evaluation report. I had a long conversation with Devon Romero about his presumptive diagnosis, and that fact that I am most concerned about early onset Alzheimer's disease. I explained that diagnosing this condition is more challenging in young individuals and as such, sometimes biomarkers can be helpful to confirm the diagnosis, which he will consider under Dr. Amparo Bristol direction. We discussed dietary, exercise, and life-style changes that may be helpful. I recommended that he consider filing for disability after his workup is complete. I took time to explain the findings and answer all the patient's questions. I encouraged Devon Romero to contact me should he have any further questions or if further follow up is desired.   Current Medications and Medical History   Current Outpatient Medications  Medication Sig Dispense Refill  . atorvastatin (LIPITOR) 40 MG tablet Take 40 mg by mouth daily.    Marland Kitchen co-enzyme Q-10 30 MG capsule Take 30 mg by mouth 3 (three) times daily.    . tadalafil (CIALIS) 20 MG tablet Take by mouth.     No current facility-administered medications for this visit.    There are no problems to display for this patient.   Mental Status and Behavioral Observations  Devon Romero was available at the prespecified time for this telephonic appointment and was alert and generally oriented (orientation not formally assessed). Speech was normal in rate, rhythm, volume, and prosody. Self-reported mood was "good" and affect as assessed by vocal quality was neutral. Thought process was logical, linear, and goal-directed for the most part and thought content  was appropriate to the topics discussed. There were no safety concerns identified at today's encounter, such as thoughts of harming self or others.   Plan  Feedback provided regarding the patient's neuropsychological evaluation. I was candid with him that I am most concerned about early onset dementia but that there is some level of uncertainty given his young age and stage of progression and that biomarkers could potentially be helpful. Devon Romero was encouraged to contact me if any questions arise or if further follow up is desired.   Viviano Simas Nicole Kindred, PsyD, ABN Clinical Neuropsychologist  The patient was seen via telephonic medicine.  The patient location was: home The provider location was: office The patient consented to the visit modality.   Service(s) Provided at This Encounter: 37 minutes (16109; Conjoint therapy with patient present)

## 2019-12-26 ENCOUNTER — Ambulatory Visit: Payer: Self-pay | Admitting: Neurology

## 2020-01-12 ENCOUNTER — Encounter: Payer: 59 | Admitting: Counselor

## 2020-01-19 ENCOUNTER — Encounter: Payer: 59 | Admitting: Counselor

## 2020-03-14 ENCOUNTER — Ambulatory Visit: Payer: 59 | Admitting: Neurology

## 2020-03-14 ENCOUNTER — Other Ambulatory Visit: Payer: Self-pay

## 2020-03-14 ENCOUNTER — Encounter: Payer: Self-pay | Admitting: Neurology

## 2020-03-14 VITALS — BP 114/77 | HR 77 | Resp 18 | Ht 68.0 in | Wt 177.0 lb

## 2020-03-14 DIAGNOSIS — G300 Alzheimer's disease with early onset: Secondary | ICD-10-CM | POA: Diagnosis not present

## 2020-03-14 DIAGNOSIS — F0280 Dementia in other diseases classified elsewhere without behavioral disturbance: Secondary | ICD-10-CM | POA: Diagnosis not present

## 2020-03-14 MED ORDER — DONEPEZIL HCL 10 MG PO TABS: ORAL_TABLET | ORAL | 11 refills | Status: DC

## 2020-03-14 NOTE — Patient Instructions (Signed)
1. Schedule spinal tap  2. Start Donepezil 10mg : take 1/2 tablet daily for 2 weeks, then increase to 1 tablet daily  3. After 2 months of starting the Donepezil, if you are noticing more anxiety/depression, we can start medication over the phone,call our office  4. Continue to monitor driving  5. The Alzheimer's Association website is helpful: [employer]Mile.co.nz  6. There are some activities which have therapeutic value and can be useful in keeping you cognitively stimulated. You can try this website: https://www.barrowneuro.org/get-to-know-barrow/centers-programs/neurorehabilitation-center/neuro-rehab-apps-and-games/ which has options, categorized by level of difficulty.  6. Follow-up in 6 months, call for any changes    FALL PRECAUTIONS: Be cautious when walking. Scan the area for obstacles that may increase the risk of trips and falls. When getting up in the mornings, sit up at the edge of the bed for a few minutes before getting out of bed. Consider elevating the bed at the head end to avoid drop of blood pressure when getting up. Walk always in a well-lit room (use night lights in the walls). Avoid area rugs or power cords from appliances in the middle of the walkways. Use a walker or a cane if necessary and consider physical therapy for balance exercise. Get your eyesight checked regularly.  FINANCIAL OVERSIGHT: Supervision, especially oversight when making financial decisions or transactions is also recommended.  HOME SAFETY: Consider the safety of the kitchen when operating appliances like stoves, microwave oven, and blender. Consider having supervision and share cooking responsibilities until no longer able to participate in those. Accidents with firearms and other hazards in the house should be identified and addressed as well.  DRIVING: Regarding driving, in patients with progressive memory problems, driving will be impaired. We advise to have someone else do the driving if trouble finding  directions or if minor accidents are reported. Independent driving assessment is available to determine safety of driving.  ABILITY TO BE LEFT ALONE: If patient is unable to contact 911 operator, consider using LifeLine, or when the need is there, arrange for someone to stay with patients. Smoking is a fire hazard, consider supervision or cessation. Risk of wandering should be assessed by caregiver and if detected at any point, supervision and safe proof recommendations should be instituted.  MEDICATION SUPERVISION: Inability to self-administer medication needs to be constantly addressed. Implement a mechanism to ensure safe administration of the medications.  RECOMMENDATIONS FOR ALL PATIENTS WITH MEMORY PROBLEMS: 1. Continue to exercise (Recommend 30 minutes of walking everyday, or 3 hours every week) 2. Increase social interactions - continue going to [place] and enjoy social gatherings with friends and family 3. Eat healthy, avoid fried foods and eat more fruits and vegetables 4. Maintain adequate blood pressure, blood sugar, and blood cholesterol level. Reducing the risk of stroke and cardiovascular disease also helps promoting better memory. 5. Avoid stressful situations. Live a simple life and avoid aggravations. Organize your time and prepare for the next day in anticipation. 6. Sleep well, avoid any interruptions of sleep and avoid any distractions in the bedroom that may interfere with adequate sleep quality 7. Avoid sugar, avoid sweets as there is a strong link between excessive sugar intake, diabetes, and cognitive impairment We discussed the Mediterranean diet, which has been shown to help patients reduce the risk of progressive memory disorders and reduces cardiovascular risk. This includes eating fish, eat fruits and green leafy vegetables, nuts like almonds and hazelnuts, walnuts, and also use olive oil. Avoid fast foods and fried foods as much as possible. Avoid sweets and  sugar as sugar  use has been linked to worsening of memory function.  There is always a concern of gradual progression of memory problems. If this is the case, then we may need to adjust level of care according to patient needs. Support, both to the patient and caregiver, should then be put into place.

## 2020-03-14 NOTE — Progress Notes (Signed)
NEUROLOGY FOLLOW UP OFFICE NOTE  SERGI Romero [id number] [birth year]  HISTORY OF PRESENT ILLNESS: I had the pleasure of seeing Devon Romero in follow-up in the neurology clinic on 03/14/2020.  The patient was last seen 4 months ago for memory loss. He is again accompanied by his fiancee Devon Romero who helps supplement the history today.  Records and images were personally reviewed where available. I personally reviewed MRI brain with and without contrast done 11/2019 which showed mild atrophy, mild prominence of the ventricles and subarachnoid space for age. He underwent Neuropsychological testing in June 2021 with a diagnosis of Early onset Alzheimer's disease. There was significant cognitive impairment including memory storage problems, some level of executve dysfunction, and visuospatial/constructional problems. He also had positive screen for mild depression and moderate anxiety symptoms, although it was noted he had difficulty interacting with visual test stimuli and may have had difficulties with the test forms, rather than a bona-fide psychiatric condition.  Since his last visit, memory is the same. He continues to get lost driving. He denies missing medications. Devon Romero reports he is not sleeping well, he worries about everything. No paranoia or hallucinations.    History on Initial Assessment 11/23/2019: This is a pleasant 54 year old right-handed man with a history of hyperlipidemia presenting for evaluation of memory loss. His fiancee Devon Romero is present to provide additional information. They have lived together for 25 years. He reports memory changes started around 2 years ago. He was laid off work in 2016 and did not work for 2 years. When he came back to work in 2018 in [employer], he kept asking things repeatedly. He used to have a keen memory, however now co-workers were telling him he was repeating questions. He states memory issues did not affect his job, he was working there  until he was furloughed in 2020. Devon Romero started noticing minor forgetfulness around that time, however in the past year, symptoms have become more noticeable. He continues to ask the same questions repeatedly. He would not remember something that had occurred, for instance he did not remember leaving the stove on this morning, which was the first time he had done this. He miss a bill last month, he has been forgetting passwords, and found a past due bill because it was not set up on autopay. She states he is usually good with money so this has never happened in the past, he has great credit. He has gotten lost driving a couple of months ago, then a couple of weeks ago he had an appointment to get his taxes done and did not know where to exit. He is only on Lipitor on a regular basis and denies missing medication. He misplaces things at home, which is not new. No family history of dementia. He denies any alcohol use. He had a concussion at age 20 on a tubing trip where he had amnesia for a day. He appears quiet and slightly withdrawn in the office, however they both report that his mood is good, "I'm happy." No paranoia or hallucinations. Devon Romero reports that he has calmed down and has become "more tolerant" this past year, he does not get as upset when she tells him he asked something already. Sleep is good, she denies any REM behavior disorder. He denies any headaches, dizziness, vision changes, dysarthria/dysphagia, neck/back pain, focal numbness/tingling/weakness, bowel/bladder dysfunction, anosmia, or tremors. No falls.    Laboratory Data: 06/2019: TSH 4.76, B12 386  PAST MEDICAL HISTORY: Past Medical History:  Diagnosis Date  . ED (erectile dysfunction)   . Hyperlipidemia     MEDICATIONS: Current Outpatient Medications on File Prior to Visit  Medication Sig Dispense Refill  . atorvastatin (LIPITOR) 40 MG tablet Take 40 mg by mouth daily.    Marland Kitchen co-enzyme Q-10 30 MG capsule Take 30 mg by mouth 3  (three) times daily.    . tadalafil (CIALIS) 20 MG tablet Take by mouth.     No current facility-administered medications on file prior to visit.    ALLERGIES: No Known Allergies  FAMILY HISTORY: Family History  Problem Relation Age of Onset  . Diabetes Mother     SOCIAL HISTORY: Social History   Socioeconomic History  . Marital status: Single    Spouse name: Not on file  . Number of children: 1  . Years of education: 73  . Highest education level: Not on file  Occupational History  . Occupation: not employed  Tobacco Use  . Smoking status: Former Smoker    Types: Cigarettes    Quit date: 07/07/2009    Years since quitting: 10.6  . Smokeless tobacco: Never Used  Vaping Use  . Vaping Use: Never used  Substance and Sexual Activity  . Alcohol use: Not Currently  . Drug use: Never  . Sexual activity: Yes    Birth control/protection: Condom  Other Topics Concern  . Not on file  Social History Narrative   Right handed   Two story home   Drinks no caffeine   Social Determinants of Health   Financial Resource Strain:   . Difficulty of Paying Living Expenses: Not on file  Food Insecurity:   . Worried About Programme researcher, broadcasting/film/video in the Last Year: Not on file  . Ran Out of Food in the Last Year: Not on file  Transportation Needs:   . Lack of Transportation (Medical): Not on file  . Lack of Transportation (Non-Medical): Not on file  Physical Activity:   . Days of Exercise per Week: Not on file  . Minutes of Exercise per Session: Not on file  Stress:   . Feeling of Stress : Not on file  Social Connections:   . Frequency of Communication with Friends and Family: Not on file  . Frequency of Social Gatherings with Friends and Family: Not on file  . Attends Religious Services: Not on file  . Active Member of Clubs or Organizations: Not on file  . Attends Banker Meetings: Not on file  . Marital Status: Not on file  Intimate Partner Violence:   . Fear of  Current or Ex-Partner: Not on file  . Emotionally Abused: Not on file  . Physically Abused: Not on file  . Sexually Abused: Not on file    PHYSICAL EXAM: Vitals:   03/14/20 1433  BP: 114/77  Pulse: 77  Resp: 18  SpO2: 99%   General: No acute distress Head:  Normocephalic/atraumatic Skin/Extremities: No rash, no edema Neurological Exam: alert and oriented to person, place, and time. No aphasia or dysarthria. Fund of knowledge is appropriate.  Recent and remote memory are impaired, repeatedly asked about lumbar puncture during visit.  Attention and concentration are reduced. Cranial nerves: Pupils equal, round. Extraocular movements intact. No facial asymmetry. Motor: moves all extremities symmetrically. Gait narrow-based and steady.   IMPRESSION: This is a pleasant 54 yo RH man with a history of hyperlipidemia who presented for memory loss. MRI brain showed mild atrophy, advanced for age. Neuropsychological evaluation indicated a diagnosis  of early onset Alzheimer's disease. Findings discussed at length with patient and fiancee today. We discussed doing a lumbar puncture. He is agreeable to start Donepezil 10mg : take 1/2 tablet daily for 2 weeks, then increase to 1 tablet daily. Side effects and expectations discussed. We discussed mood changes that can occur, we can start an SSRI later on if needed. We discussed the importance of control of vascular risk factors, physical exercise,and brain stimulation exercises for brain health. Continue to monitor driving, he was advised to have company in the car at all times. Follow-up in 6 months, he knows to call for any changes.    Thank you for allowing me to participate in his care.  Please do not hesitate to call for any questions or concerns.   Patrcia Dolly, M.D.   CC: Dr. Manus Gunning

## 2020-03-20 ENCOUNTER — Other Ambulatory Visit: Payer: Self-pay

## 2020-03-20 DIAGNOSIS — R413 Other amnesia: Secondary | ICD-10-CM

## 2020-03-20 DIAGNOSIS — G300 Alzheimer's disease with early onset: Secondary | ICD-10-CM

## 2020-03-20 DIAGNOSIS — F0280 Dementia in other diseases classified elsewhere without behavioral disturbance: Secondary | ICD-10-CM

## 2020-03-26 ENCOUNTER — Ambulatory Visit
Admission: RE | Admit: 2020-03-26 | Discharge: 2020-03-26 | Disposition: A | Discharge status: Home / Self Care | Payer: 59 | Source: Ambulatory Visit | Attending: Neurology | Admitting: Neurology

## 2020-03-26 ENCOUNTER — Other Ambulatory Visit (HOSPITAL_COMMUNITY)
Admission: RE | Admit: 2020-03-26 | Discharge: 2020-03-26 | Disposition: A | Discharge status: Home / Self Care | Payer: 59 | Source: Ambulatory Visit | Attending: Neurology | Admitting: Neurology

## 2020-03-26 ENCOUNTER — Other Ambulatory Visit: Payer: Self-pay

## 2020-03-26 DIAGNOSIS — F0280 Dementia in other diseases classified elsewhere without behavioral disturbance: Secondary | ICD-10-CM | POA: Diagnosis present

## 2020-03-26 DIAGNOSIS — R413 Other amnesia: Secondary | ICD-10-CM | POA: Diagnosis not present

## 2020-03-26 DIAGNOSIS — G300 Alzheimer's disease with early onset: Secondary | ICD-10-CM

## 2020-03-26 NOTE — Discharge Instructions (Signed)

## 2020-03-27 LAB — CYTOLOGY - NON PAP

## 2020-04-09 LAB — MAYO MISC ORDER: PRICE:: 1643.2

## 2020-04-24 LAB — CSF CELL COUNT WITH DIFFERENTIAL
RBC Count, CSF: 0 cells/uL
WBC, CSF: 1 cells/uL (ref 0–5)

## 2020-04-24 LAB — FUNGUS CULTURE W SMEAR
CULTURE:: NO GROWTH
MICRO NUMBER:: 10970542
SMEAR:: NONE SEEN
SPECIMEN QUALITY:: ADEQUATE

## 2020-04-24 LAB — CRYPTOCOCCAL AG, LTX SCR RFLX TITER
Cryptococcal Ag Screen: NOT DETECTED
MICRO NUMBER:: 10970541
SPECIMEN QUALITY:: ADEQUATE

## 2020-04-24 LAB — MAYO MISC ORDER 2: PRICE:: 1240

## 2020-04-24 LAB — PROTEIN, CSF: Total Protein, CSF: 59 mg/dL — ABNORMAL HIGH (ref 15–45)

## 2020-04-24 LAB — GLUCOSE, CSF: Glucose, CSF: 54 mg/dL (ref 40–80)

## 2020-07-04 IMAGING — MR MR HEAD WO/W CM
12 series · 48 of 48 positions shown · IV contrast (16ml Multihance)
Comparison: None.

CLINICAL DATA: Memory loss

EXAM:
MRI HEAD WITHOUT AND WITH CONTRAST
TECHNIQUE: Multiplanar, multiecho pulse sequences of the brain and surrounding
structures were obtained without and with intravenous contrast.
CONTRAST:  16mL MULTIHANCE GADOBENATE DIMEGLUMINE 529 MG/ML IV SOLN

[Series 5: T1 · sagittal · 4.0mm · 0.75mm/px · 1 of 31 slices shown (1 of 3)]
[im 1/31]
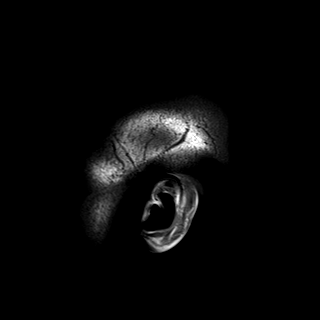

[Series 6: DWI · axial · 3.0mm · 1.44mm/px · z∈[-46,+96]mm · 4 of 88 slices shown (1 of 4)]
[im 1/88]
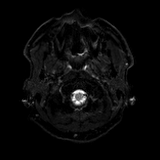
[im 30/88]
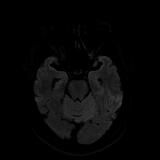
[im 59/88]
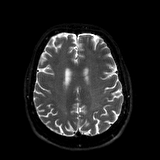
[im 88/88]
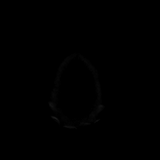

[Series 7: DWI · axial · 3.0mm · 1.44mm/px · z∈[-46,+96]mm · 3 of 44 slices shown (2 of 4)]
[im 1/44]
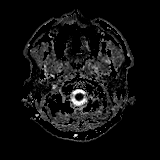
[im 22/44]
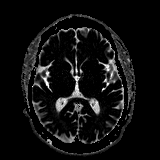
[im 44/44]
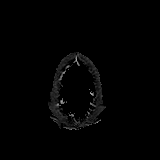

[Series 8: DWI · coronal · 5.0mm · 1.44mm/px · 4 of 64 slices shown (3 of 4)]
[im 1/64]
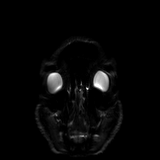
[im 22/64]
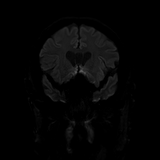
[im 43/64]
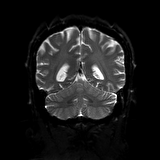
[im 64/64]
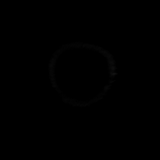

[Series 9: DWI · coronal · 5.0mm · 1.44mm/px · 2 of 32 slices shown (4 of 4)]
[im 1/32]
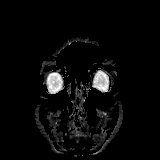
[im 32/32]
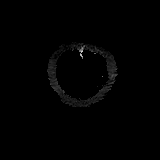

[Series 10: T2 · axial · 4.0mm · 0.36mm/px · z∈[-50,+95]mm · 2 of 29 slices shown]
[im 1/29]
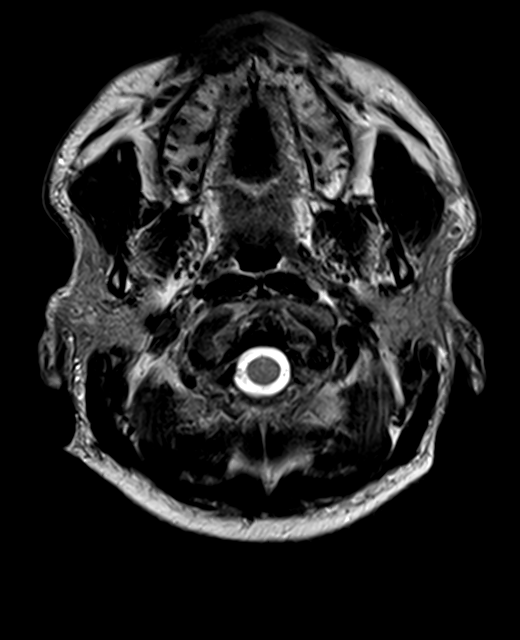
[im 29/29]
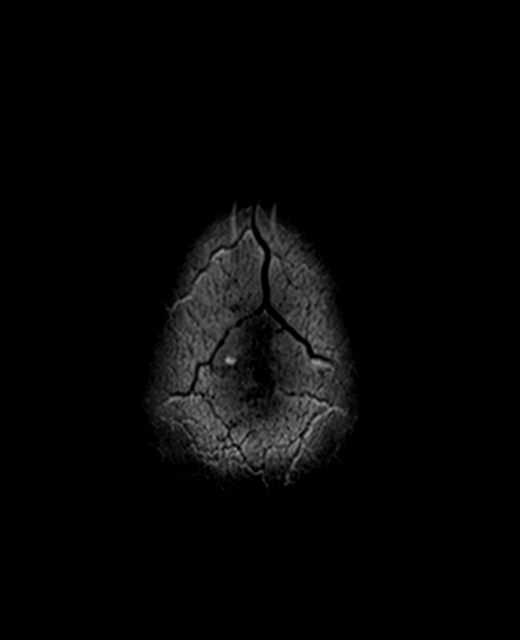

[Series 11: FLAIR · axial · 3.0mm · 0.72mm/px · z∈[-52,+98]mm · 2 of 26 slices shown]
[im 1/26]
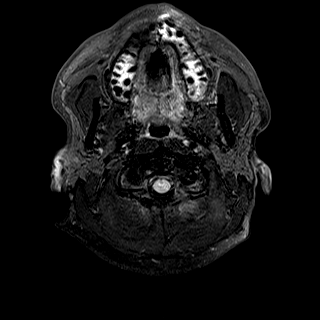
[im 26/26]
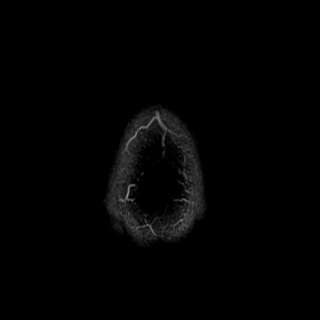

[Series 13: swi_images · axial · 1.5mm · 0.90mm/px · z∈[-49,+94]mm · 6 of 96 slices shown]
[im 1/96]
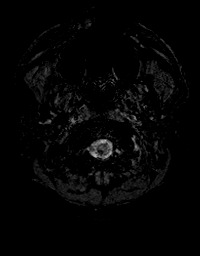
[im 20/96]
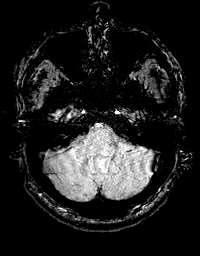
[im 39/96]
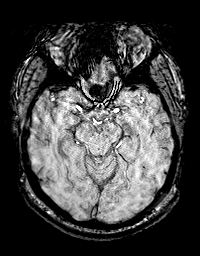
[im 58/96]
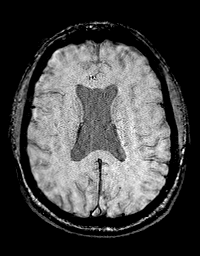
[im 77/96]
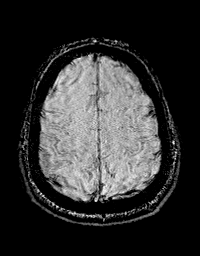
[im 96/96]
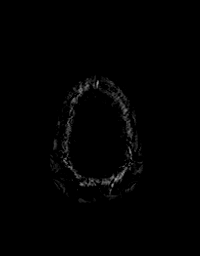

[Series 14: T1 · axial · 1.0mm · 0.94mm/px · z∈[-67,+92]mm · 10 of 160 slices shown (2 of 3)]
[im 1/160]
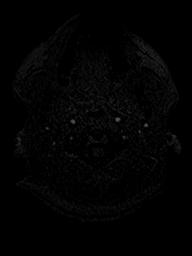
[im 18/160]
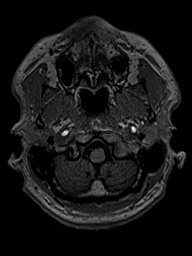
[im 36/160]
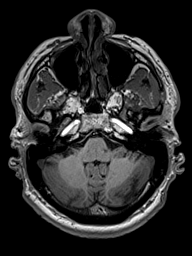
[im 54/160]
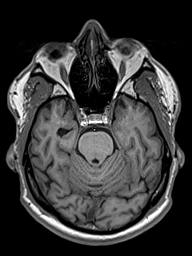
[im 71/160]
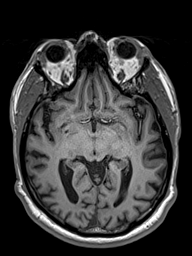
[im 89/160]
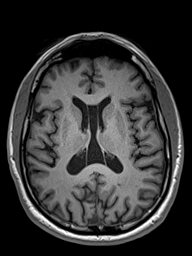
[im 107/160]
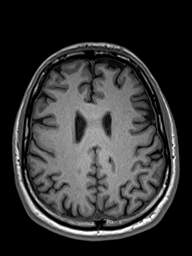
[im 124/160]
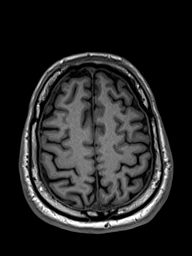
[im 142/160]
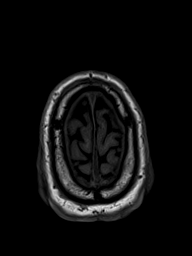
[im 160/160]
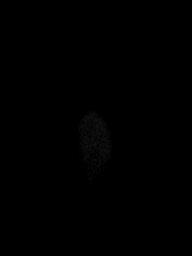

[Series 16: T1 · axial · 1.0mm · 0.94mm/px · z∈[-67,+92]mm · 10 of 160 slices shown (3 of 3)]
[im 1/160]
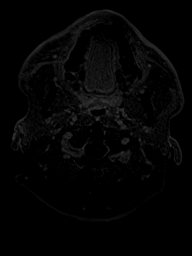
[im 18/160]
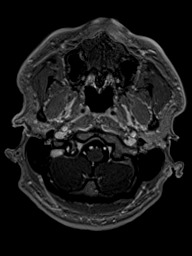
[im 36/160]
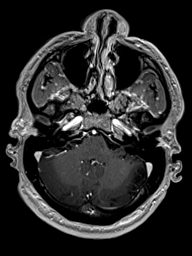
[im 54/160]
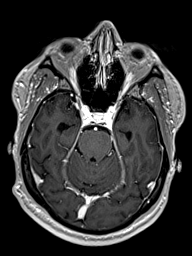
[im 71/160]
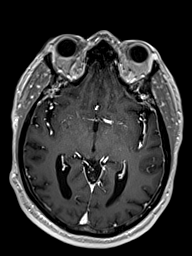
[im 89/160]
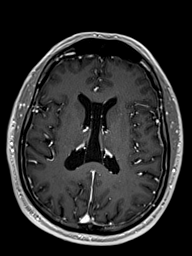
[im 107/160]
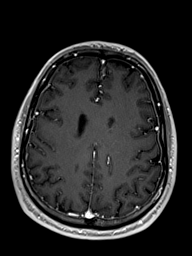
[im 124/160]
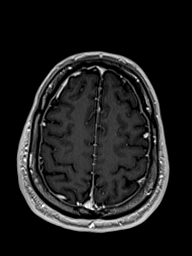
[im 142/160]
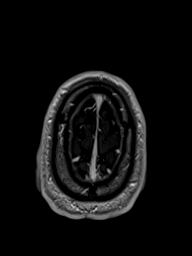
[im 160/160]
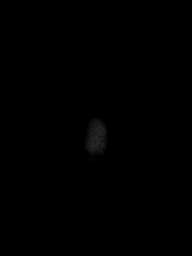

[Series 17: T1 post-contrast · coronal · 4.0mm · 0.72mm/px · 2 of 35 slices shown]
[im 1/35]
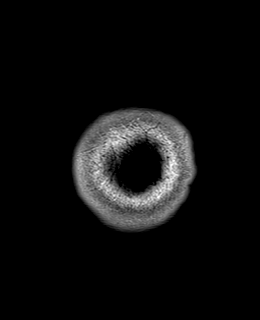
[im 35/35]
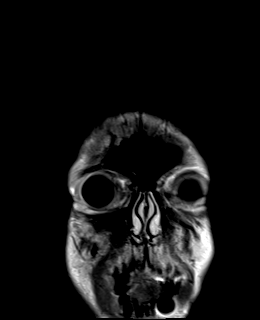

[Series 18: T2 post-contrast · coronal · 4.0mm · 0.36mm/px · 2 of 35 slices shown]
[im 1/35]
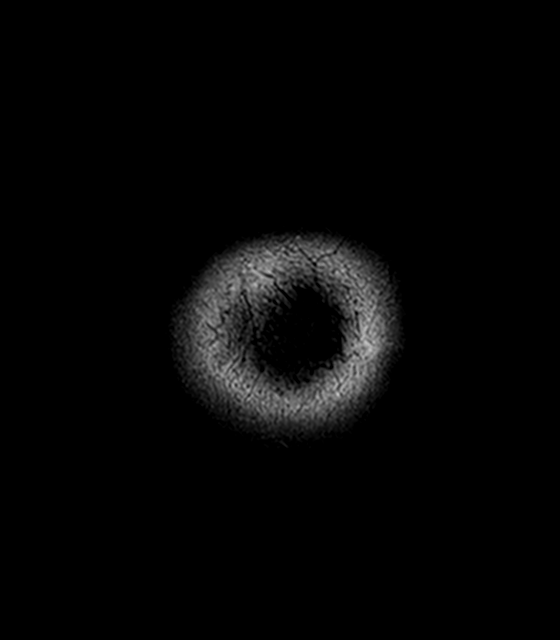
[im 35/35]
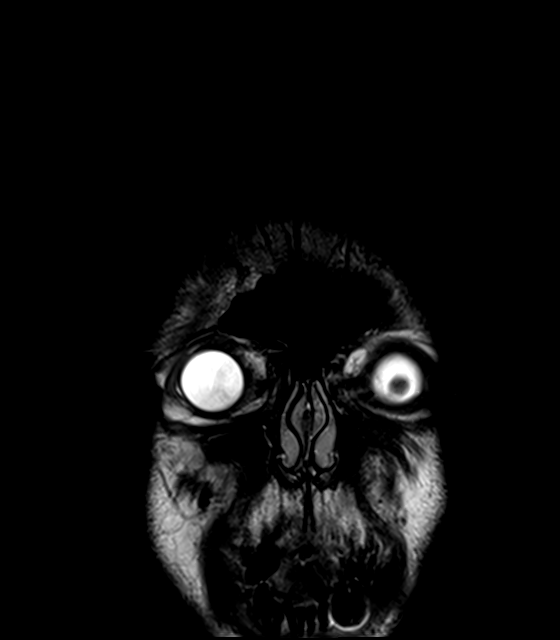

[48 of 48 positions shown; findings below may reference images not displayed]

FINDINGS: Brain: Mild atrophy. Mild prominence of the ventricles and
subarachnoid space for age. Negative for acute infarct. No
significant chronic ischemia. Negative for hemorrhage or mass

Normal enhancement following contrast administration.

Vascular: Normal arterial flow voids

Skull and upper cervical spine: No focal skeletal abnormality.

Sinuses/Orbits: Chronic fracture left medial orbit. There is a 9 mm
cyst in the fracture defect of uncertain etiology.

Mild mucosal edema paranasal sinuses

Other: None
IMPRESSION: Mild atrophy for age.  No acute intracranial abnormality

Chronic fracture left medial orbit.

## 2020-07-05 ENCOUNTER — Telehealth: Payer: Self-pay | Admitting: Neurology

## 2020-07-05 NOTE — Telephone Encounter (Signed)
Patient advised.

## 2020-07-05 NOTE — Telephone Encounter (Signed)
Patient's daughter called in stating she never heard back from results on the patient's Lumbar Puncture on 03/26/20.

## 2020-07-05 NOTE — Telephone Encounter (Signed)
No answer at 1114 07/05/2020

## 2020-07-05 NOTE — Telephone Encounter (Signed)
Please see, looks like this was never read.

## 2020-07-05 NOTE — Telephone Encounter (Signed)
Not sure what happened, pls send apologies for delay. The spinal tap did not show any infection or inflammation as cause for the dementia, it did show changes that provided additional confirmation of early onset Alzheimer's disease that was seen on his memory evaluation. How is he doing? Thanks

## 2020-10-11 DIAGNOSIS — Z86010 Personal history of colonic polyps: Secondary | ICD-10-CM | POA: Insufficient documentation

## 2020-10-11 DIAGNOSIS — I10 Essential (primary) hypertension: Secondary | ICD-10-CM

## 2020-10-11 DIAGNOSIS — K5730 Diverticulosis of large intestine without perforation or abscess without bleeding: Secondary | ICD-10-CM

## 2020-10-11 DIAGNOSIS — Z8546 Personal history of malignant neoplasm of prostate: Secondary | ICD-10-CM | POA: Insufficient documentation

## 2020-10-11 DIAGNOSIS — Z860101 Personal history of adenomatous and serrated colon polyps: Secondary | ICD-10-CM

## 2020-10-11 DIAGNOSIS — E039 Hypothyroidism, unspecified: Secondary | ICD-10-CM | POA: Insufficient documentation

## 2020-10-11 HISTORY — DX: Personal history of colonic polyps: Z86.010

## 2020-10-11 HISTORY — DX: Essential (primary) hypertension: I10

## 2020-10-11 HISTORY — DX: Personal history of adenomatous and serrated colon polyps: Z86.0101

## 2020-10-11 HISTORY — DX: Hypothyroidism, unspecified: E03.9

## 2020-10-11 HISTORY — DX: Diverticulosis of large intestine without perforation or abscess without bleeding: K57.30

## 2020-10-11 NOTE — Progress Notes (Signed)
Assessment/Plan:   Alzheimer's dementia, early onset without behavioral disturbance  This is a pleasant 55 year old right-handed man, with a history of hyperlipidemia, hypertension, hypothyroidism, history of prostate cancer s/p prostatectomy, initially presenting for memory loss in May 2021.  At the time, MRI of the brain showed mild atrophy, advanced for age.  A neuropsychological evaluation indicated a diagnosis of early onset of Alzheimer's disease.  Lumbar puncture was negative for infection or inflammation, but did show changes that provided additional information of early onset of AD. He was started on donepezil 10 mg daily on 03/14/20 and tolerating it well.  He is stable from a cognitive standpoint.  Discussed the diagnosis of Alzheimer's disease and the LP results, and all his questions were answered  Recommendations . Discussed safety both in and out of the home. Discussed the importance of regular daily schedule with inclusion of crossword puzzles to maintain brain function.  . Continue to stay active exercising  at least 30 minutes at least 3 times a week.  . Naps should be scheduled and should be no longer than 60 minutes and should not occur after 2 PM.  . Mediterranean diet is recommended. Book with pertinent information regarding this diet was given. . Monitor under or over paying bills, patient and his fiance agreed to look at this closely . Continue donepezil 10 mg daily.  Refill was sent to the pharmacy . Follow-up in 6 months, he knows to call if he has any questions or concerns.  Case discussed with Dr. Delice Lesch who agrees with the plan     Subjective:   Devon Romero was seen today in follow up for memory loss.  This patient is accompanied in the office by his Weiser who supplements the history.  My previous records as well as any outside records available were reviewed prior to todays visit.  Patient is currently on donepezil 10 mg a day. Patient reports that  overall he is doing well, "if not better ".  He stopped taking Prevagen for about 6 months already.  He reports exercising more, walking, using stationary bicycle for 30 minutes, and also using the jump rope, around 200 jumps.  He stopped "eating junk, lost 7 pounds over the last 6 months ", and he feels stronger.  He denies any headaches, dizziness, vision changes, dysarthria, dysphagia, neck or back pain.  He denies any urinary or bowel incontinence.  He denies any focal numbness, tingling or weaknesses.  He he denies any recent falls.  No hallucinations, vivid dreams or paranoia is reported in his fiance agrees.  He cooks occasionally, but denies leaving the stove on, or the faucet on.  He dresses himself and showers daily.  Of note, although he pays bills by himself, he admits to missing "a little more often a bill or 2 ".  His mood is good, occasionally he is easily irritated, not worse than before.  He denies any depression or anxiety.  "History on Initial Assessment 11/23/2019: This is a pleasant 55 year old right-handed man with a history of hyperlipidemia presenting for evaluation of memory loss. His fiancee Devon Romero is present to provide additional information. They have lived together for 25 years. He reports memory changes started around 2 years ago. He was laid off work in 2016 and did not work for 2 years. When he came back to work in 2018 in [employer], he kept asking things repeatedly. He used to have a keen memory, however now co-workers were telling  him he was repeating questions. He states memory issues did not affect his job, he was working there until he was furloughed in 2020. Devon Romero started noticing minor forgetfulness around that time, however in the past year, symptoms have become more noticeable. He continues to ask the same questions repeatedly. He would not remember something that had occurred, for instance he did not remember leaving the stove on this morning, which was the  first time he had done this. He miss a bill last month, he has been forgetting passwords, and found a past due bill because it was not set up on autopay. She states he is usually good with money so this has never happened in the past, he has great credit. He has gotten lost driving a couple of months ago, then a couple of weeks ago he had an appointment to get his taxes done and did not know where to exit. He is only on Lipitor on a regular basis and denies missing medication. He misplaces things at home, which is not new. No family history of dementia. He denies any alcohol use. He had a concussion at age 89 on a tubing trip where he had amnesia for a day. He appears quiet and slightly withdrawn in the office, however they both report that his mood is good, "I'm happy." No paranoia or hallucinations. Devon Romero reports that he has calmed down and has become "more tolerant" this past year, he does not get as upset when she tells him he asked something already. Sleep is good, she denies any REM behavior disorder. He denies any headaches, dizziness, vision changes, dysarthria/dysphagia, neck/back pain, focal numbness/tingling/weakness, bowel/bladder dysfunction, anosmia, or tremors. No falls.    Laboratory Data: 06/2019: TSH 4.76, B12 386 "  PREVIOUS MEDICATIONS: None  CURRENT MEDICATIONS:  Outpatient Encounter Medications as of 10/12/2020  Medication Sig  . atorvastatin (LIPITOR) 40 MG tablet Take 40 mg by mouth daily.  Marland Kitchen co-enzyme Q-10 30 MG capsule Take 30 mg by mouth 3 (three) times daily.  . Coenzyme Q10 (CO Q-10) 300 MG CAPS 1 capsule with a meal  . donepezil (ARICEPT) 10 MG tablet Take 1/2 tablet daily for 2 weeks, then increase to 1 tablet daily  . tadalafil (CIALIS) 20 MG tablet Take by mouth.  . Apoaequorin (PREVAGEN) 10 MG CAPS 1 capsule (Patient not taking: Reported on 10/12/2020)  . donepezil (ARICEPT) 10 MG tablet 1 tablet at bedtime. (Patient not taking: Reported on 10/12/2020)   No  facility-administered encounter medications on file as of 10/12/2020.     Objective:     PHYSICAL EXAMINATION:    VITALS:   Vitals:   10/12/20 1512  BP: 127/82  Pulse: (!) 57  Resp: 20  SpO2: 99%  Weight: 175 lb (79.4 kg)  Height: 5\' 8"  (1.727 m)    GEN:  The patient appears stated age and is in NAD. HEENT:  Normocephalic, atraumatic.   Neurological examination:  Orientation: The patient is alert. Oriented to person, place and time.  Fund of knowledge is appropriate.   Cranial nerves: There is good facial symmetry.The speech is fluent and clear.  Hearing is intact to conversational tone. Sensation: Sensation is intact to light touch throughout Motor: Strength is at least antigravity x4.  Movement examination: Tone: There is normal tone in the UE/LE Abnormal movements:  no tremor.  No myoclonus.  No asterixis.   Coordination:  There is no decremation with RAM's. Gait and Station: The patient has no difficulty arising out of a  deep-seated chair without the use of the hands. The patient's stride length is good.     CBC No results found for: WBC, RBC, HGB, HCT, PLT, MCV, MCH, MCHC, RDW, LYMPHSABS, MONOABS, EOSABS, BASOSABS   No flowsheet data found.     Total time spent on today's visit was 40 minutes, including both face-to-face time and nonface-to-face time.  Time included that spent on review of records (prior notes available to me/labs/imaging if pertinent), discussing treatment and goals, answering patient's questions and coordinating care.  Cc:  Gaynelle Arabian, MD Sharene Butters, PA-C

## 2020-10-12 ENCOUNTER — Ambulatory Visit: Payer: 59 | Admitting: Neurology

## 2020-10-12 ENCOUNTER — Encounter: Payer: Self-pay | Admitting: Neurology

## 2020-10-12 ENCOUNTER — Other Ambulatory Visit: Payer: Self-pay

## 2020-10-12 VITALS — BP 127/82 | HR 57 | Resp 20 | Ht 68.0 in | Wt 175.0 lb

## 2020-10-12 DIAGNOSIS — G300 Alzheimer's disease with early onset: Secondary | ICD-10-CM | POA: Diagnosis not present

## 2020-10-12 DIAGNOSIS — F0280 Dementia in other diseases classified elsewhere without behavioral disturbance: Secondary | ICD-10-CM

## 2020-10-12 MED ORDER — DONEPEZIL HCL 10 MG PO TABS
ORAL_TABLET | ORAL | 3 refills | Status: DC
Start: 2020-10-12 — End: 2021-04-19

## 2020-10-12 NOTE — Patient Instructions (Signed)
Good to see you! Continue Donepezil 10mg  daily. Follow-up in 6 months, call for any changes.   FALL PRECAUTIONS: Be cautious when walking. Scan the area for obstacles that may increase the risk of trips and falls. When getting up in the mornings, sit up at the edge of the bed for a few minutes before getting out of bed. Consider elevating the bed at the head end to avoid drop of blood pressure when getting up. Walk always in a well-lit room (use night lights in the walls). Avoid area rugs or power cords from appliances in the middle of the walkways. Use a walker or a cane if necessary and consider physical therapy for balance exercise. Get your eyesight checked regularly.  FINANCIAL OVERSIGHT: Supervision, especially oversight when making financial decisions or transactions is also recommended as difficulty arises.  HOME SAFETY: Consider the safety of the kitchen when operating appliances like stoves, microwave oven, and blender. Consider having supervision and share cooking responsibilities until no longer able to participate in those. Accidents with firearms and other hazards in the house should be identified and addressed as well.  DRIVING: Regarding driving, in patients with progressive memory problems, driving will be impaired. We advise to have someone else do the driving if trouble finding directions or if minor accidents are reported. Independent driving assessment is available to determine safety of driving.  ABILITY TO BE LEFT ALONE: If patient is unable to contact 911 operator, consider using LifeLine, or when the need is there, arrange for someone to stay with patients. Smoking is a fire hazard, consider supervision or cessation. Risk of wandering should be assessed by caregiver and if detected at any point, supervision and safe proof recommendations should be instituted.  MEDICATION SUPERVISION: Inability to self-administer medication needs to be constantly addressed. Implement a mechanism  to ensure safe administration of the medications.  RECOMMENDATIONS FOR ALL PATIENTS WITH MEMORY PROBLEMS: 1. Continue to exercise (Recommend 30 minutes of walking everyday, or 3 hours every week) 2. Increase social interactions - continue going to [place] and enjoy social gatherings with friends and family 3. Eat healthy, avoid fried foods and eat more fruits and vegetables 4. Maintain adequate blood pressure, blood sugar, and blood cholesterol level. Reducing the risk of stroke and cardiovascular disease also helps promoting better memory. 5. Avoid stressful situations. Live a simple life and avoid aggravations. Organize your time and prepare for the next day in anticipation. 6. Sleep well, avoid any interruptions of sleep and avoid any distractions in the bedroom that may interfere with adequate sleep quality 7. Avoid sugar, avoid sweets as there is a strong link between excessive sugar intake, diabetes, and cognitive impairment We discussed the Mediterranean diet, which has been shown to help patients reduce the risk of progressive memory disorders and reduces cardiovascular risk. This includes eating fish, eat fruits and green leafy vegetables, nuts like almonds and hazelnuts, walnuts, and also use olive oil. Avoid fast foods and fried foods as much as possible. Avoid sweets and sugar as sugar use has been linked to worsening of memory function.  There is always a concern of gradual progression of memory problems. If this is the case, then we may need to adjust level of care according to patient needs. Support, both to the patient and caregiver, should then be put into place.

## 2020-10-15 ENCOUNTER — Encounter: Payer: Self-pay | Admitting: Neurology

## 2020-10-15 NOTE — Progress Notes (Signed)
Patient was seen, evaluated, and treatment plan was discussed with the Advanced Practice Provider. Discussed LP results with patient and fiancee, negative paraneoplastic panel. The p-Tau/Abeta42 ratio was high (0.082; ref <0.023), consistent with pathological changes associated with AD. He has been doing well on Donepezil 10mg  daily, we again discussed control of vascular risk factors, physical exercise, Mediterranean diet.  I have also reviewed the orders written for this patient which were under my direction. I agree with the findings and the plan of care as documented by the Advanced Practice Provider.

## 2021-04-19 ENCOUNTER — Other Ambulatory Visit: Payer: Self-pay

## 2021-04-19 ENCOUNTER — Ambulatory Visit: Payer: 59 | Admitting: Physician Assistant

## 2021-04-19 ENCOUNTER — Encounter: Payer: Self-pay | Admitting: Physician Assistant

## 2021-04-19 VITALS — BP 107/71 | HR 55 | Resp 18 | Ht 68.0 in | Wt 172.0 lb

## 2021-04-19 DIAGNOSIS — G300 Alzheimer's disease with early onset: Secondary | ICD-10-CM

## 2021-04-19 DIAGNOSIS — F0280 Dementia in other diseases classified elsewhere without behavioral disturbance: Secondary | ICD-10-CM | POA: Diagnosis not present

## 2021-04-19 MED ORDER — DONEPEZIL HCL 10 MG PO TABS: ORAL_TABLET | ORAL | 3 refills | Status: DC
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
## 2021-04-19 NOTE — Patient Instructions (Signed)
It was a pleasure to see you today at our office.   Recommendations:  Meds: Follow up in  6 months Continue donepezil 10 mg daily.     RECOMMENDATIONS FOR ALL PATIENTS WITH MEMORY PROBLEMS: 1. Continue to exercise (Recommend 30 minutes of walking everyday, or 3 hours every week) 2. Increase social interactions - continue going to [place] and enjoy social gatherings with friends and family 3. Eat healthy, avoid fried foods and eat more fruits and vegetables 4. Maintain adequate blood pressure, blood sugar, and blood cholesterol level. Reducing the risk of stroke and cardiovascular disease also helps promoting better memory. 5. Avoid stressful situations. Live a simple life and avoid aggravations. Organize your time and prepare for the next day in anticipation. 6. Sleep well, avoid any interruptions of sleep and avoid any distractions in the bedroom that may interfere with adequate sleep quality 7. Avoid sugar, avoid sweets as there is a strong link between excessive sugar intake, diabetes, and cognitive impairment We discussed the Mediterranean diet, which has been shown to help patients reduce the risk of progressive memory disorders and reduces cardiovascular risk. This includes eating fish, eat fruits and green leafy vegetables, nuts like almonds and hazelnuts, walnuts, and also use olive oil. Avoid fast foods and fried foods as much as possible. Avoid sweets and sugar as sugar use has been linked to worsening of memory function.  There is always a concern of gradual progression of memory problems. If this is the case, then we may need to adjust level of care according to patient needs. Support, both to the patient and caregiver, should then be put into place.    The Alzheimer's Association is here all day, every day for people facing Alzheimer's disease through our free 24/7 Helpline: 929-309-5809. The Helpline provides reliable information and support to all those who need assistance, such  as individuals living with memory loss, Alzheimer's or other dementia, caregivers, health care professionals and the public.  Our highly trained and knowledgeable staff can help you with: Understanding memory loss, dementia and Alzheimer's  Medications and other treatment options  General information about aging and brain health  Skills to provide quality care and to find the best care from professionals  Legal, financial and living-arrangement decisions Our Helpline also features: Confidential care consultation provided by master's level clinicians who can help with decision-making support, crisis assistance and education on issues families face every day  Help in a caller's preferred language using our translation service that features more than 200 languages and dialects  Referrals to local community programs, services and ongoing support     FALL PRECAUTIONS: Be cautious when walking. Scan the area for obstacles that may increase the risk of trips and falls. When getting up in the mornings, sit up at the edge of the bed for a few minutes before getting out of bed. Consider elevating the bed at the head end to avoid drop of blood pressure when getting up. Walk always in a well-lit room (use night lights in the walls). Avoid area rugs or power cords from appliances in the middle of the walkways. Use a walker or a cane if necessary and consider physical therapy for balance exercise. Get your eyesight checked regularly.  FINANCIAL OVERSIGHT: Supervision, especially oversight when making financial decisions or transactions is also recommended.  HOME SAFETY: Consider the safety of the kitchen when operating appliances like stoves, microwave oven, and blender. Consider having supervision and share cooking responsibilities until no longer able to participate  in those. Accidents with firearms and other hazards in the house should be identified and addressed as well.   ABILITY TO BE LEFT ALONE: If  patient is unable to contact 911 operator, consider using LifeLine, or when the need is there, arrange for someone to stay with patients. Smoking is a fire hazard, consider supervision or cessation. Risk of wandering should be assessed by caregiver and if detected at any point, supervision and safe proof recommendations should be instituted.  MEDICATION SUPERVISION: Inability to self-administer medication needs to be constantly addressed. Implement a mechanism to ensure safe administration of the medications.   DRIVING: Regarding driving, in patients with progressive memory problems, driving will be impaired. We advise to have someone else do the driving if trouble finding directions or if minor accidents are reported. Independent driving assessment is available to determine safety of driving.   If you are interested in the driving assessment, you can contact the following:  [employer] in [place]  [place] [phone] or [phone]      [place] refers to food and lifestyle choices that are based on the traditions of countries located on the [employer]. This way of eating has been shown to help prevent certain conditions and improve outcomes for people who have chronic diseases, like kidney disease and heart disease. What are tips for following this plan? Lifestyle  Cook and eat meals together with your family, when possible. Drink enough fluid to keep your urine clear or pale yellow. Be physically active every day. This includes: Aerobic exercise like running or swimming. Leisure activities like gardening, walking, or housework. Get 7-8 hours of sleep each night. If recommended by your health care provider, drink red wine in moderation. This means 1 glass a day for nonpregnant women and 2 glasses a day for men. A glass of wine  equals 5 oz (150 mL). Reading food labels  Check the serving size of packaged foods. For foods such as rice and pasta, the serving size refers to the amount of cooked product, not dry. Check the total fat in packaged foods. Avoid foods that have saturated fat or trans fats. Check the ingredients list for added sugars, such as corn syrup. Shopping  At the grocery store, buy most of your food from the areas near the walls of the store. This includes: Fresh fruits and vegetables (produce). Grains, beans, nuts, and seeds. Some of these may be available in unpackaged forms or large amounts (in bulk). Fresh seafood. Poultry and eggs. Low-fat dairy products. Buy whole ingredients instead of prepackaged foods. Buy fresh fruits and vegetables in-season from local farmers markets. Buy frozen fruits and vegetables in resealable bags. If you do not have access to quality fresh seafood, buy precooked frozen shrimp or canned fish, such as tuna, salmon, or sardines. Buy small amounts of raw or cooked vegetables, salads, or olives from the deli or salad bar at your store. Stock your pantry so you always have certain foods on hand, such as olive oil, canned tuna, canned tomatoes, rice, pasta, and beans. Cooking  Cook foods with extra-virgin olive oil instead of using butter or other vegetable oils. Have meat as a side dish, and have vegetables or grains as your main dish. This means having meat in small portions or adding small amounts of meat to foods like pasta or stew. Use beans or vegetables instead of meat in common dishes like  chili or lasagna. Experiment with different cooking methods. Try roasting or broiling vegetables instead of steaming or sauteing them. Add frozen vegetables to soups, stews, pasta, or rice. Add nuts or seeds for added healthy fat at each meal. You can add these to yogurt, salads, or vegetable dishes. Marinate fish or vegetables using olive oil, lemon juice, garlic, and fresh  herbs. Meal planning  Plan to eat 1 vegetarian meal one day each week. Try to work up to 2 vegetarian meals, if possible. Eat seafood 2 or more times a week. Have healthy snacks readily available, such as: Vegetable sticks with hummus. Greek yogurt. Fruit and nut trail mix. Eat balanced meals throughout the week. This includes: Fruit: 2-3 servings a day Vegetables: 4-5 servings a day Low-fat dairy: 2 servings a day Fish, poultry, or lean meat: 1 serving a day Beans and legumes: 2 or more servings a week Nuts and seeds: 1-2 servings a day Whole grains: 6-8 servings a day Extra-virgin olive oil: 3-4 servings a day Limit red meat and sweets to only a few servings a month What are my food choices? Mediterranean diet Recommended Grains: Whole-grain pasta. Brown rice. Bulgar wheat. Polenta. Couscous. Whole-wheat bread. [place]. Vegetables: Artichokes. Beets. Broccoli. Cabbage. Carrots. Eggplant. Green beans. Chard. Kale. Spinach. Onions. Leeks. Peas. Squash. Tomatoes. Peppers. Radishes. Fruits: Apples. Apricots. Avocado. Berries. Bananas. Cherries. Dates. Figs. Grapes. Lemons. Melon. Oranges. Peaches. Plums. Pomegranate. Meats and other protein foods: Beans. Almonds. Sunflower seeds. Pine nuts. Peanuts. [place]. Salmon. Scallops. Shrimp. [place]. Tilapia. Clams. Oysters. Eggs. Dairy: Low-fat milk. Cheese. Greek yogurt. Beverages: Water. Red wine. Herbal tea. Fats and oils: Extra virgin olive oil. Avocado oil. Grape seed oil. Sweets and desserts: [place] yogurt with honey. Baked apples. Poached pears. Trail mix. Seasoning and other foods: Basil. Cilantro. Coriander. Cumin. Mint. Parsley. Sage. Rosemary. Tarragon. Garlic. Oregano. Thyme. Pepper. Balsalmic vinegar. Tahini. Hummus. Tomato sauce. Olives. Mushrooms. Limit these Grains: Prepackaged pasta or rice dishes. Prepackaged cereal with added sugar. Vegetables: Deep fried potatoes (french fries). Fruits: Fruit canned in syrup. Meats and  other protein foods: Beef. Pork. Lamb. Poultry with skin. Hot dogs. [place] Salines. Dairy: Ice cream. Sour cream. Whole milk. Beverages: Juice. Sugar-sweetened soft drinks. Beer. Liquor and spirits. Fats and oils: Butter. Canola oil. Vegetable oil. Beef fat (tallow). Lard. Sweets and desserts: Cookies. Cakes. Pies. Candy. Seasoning and other foods: Mayonnaise. Premade sauces and marinades. The items listed may not be a complete list. Talk with your dietitian about what dietary choices are right for you. Summary The Mediterranean diet includes both food and lifestyle choices. Eat a variety of fresh fruits and vegetables, beans, nuts, seeds, and whole grains. Limit the amount of red meat and sweets that you eat. Talk with your health care provider about whether it is safe for you to drink red wine in moderation. This means 1 glass a day for nonpregnant women and 2 glasses a day for men. A glass of wine equals 5 oz (150 mL). This information is not intended to replace advice given to you by your health care provider. Make sure you discuss any questions you have with your health care provider. Document Released: 02/14/2016 Document Revised: 03/18/2016 Document Reviewed: 02/14/2016 Elsevier Interactive Patient Education  2017 [employer].

## 2021-05-11 ENCOUNTER — Ambulatory Visit (HOSPITAL_COMMUNITY)
Admission: EM | Admit: 2021-05-11 | Discharge: 2021-05-11 | Disposition: A | Discharge status: Home / Self Care | Payer: 59

## 2021-05-11 ENCOUNTER — Other Ambulatory Visit: Payer: Self-pay

## 2021-05-11 DIAGNOSIS — Z5321 Procedure and treatment not carried out due to patient leaving prior to being seen by health care provider: Secondary | ICD-10-CM

## 2021-05-11 NOTE — ED Notes (Signed)
No answer

## 2021-05-11 NOTE — ED Notes (Signed)
Called in lobby no response x 5 .

## 2021-05-11 NOTE — ED Provider Notes (Signed)
[facility]   Hazel Sams, PA-C 05/11/21 1634

## 2021-08-20 ENCOUNTER — Encounter: Payer: Self-pay | Admitting: Physician Assistant

## 2021-08-20 ENCOUNTER — Telehealth: Payer: 59 | Admitting: Physician Assistant

## 2021-08-20 DIAGNOSIS — Z20822 Contact with and (suspected) exposure to covid-19: Secondary | ICD-10-CM | POA: Diagnosis not present

## 2021-08-20 MED ORDER — BENZONATATE 100 MG PO CAPS: 100.0000 mg | ORAL_CAPSULE | Freq: Three times a day (TID) | ORAL | 0 refills | Status: AC

## 2021-08-20 MED ORDER — MOLNUPIRAVIR EUA 200MG CAPSULE: 4.0000 | ORAL_CAPSULE | Freq: Two times a day (BID) | ORAL | 0 refills | Status: AC

## 2021-08-20 NOTE — Progress Notes (Signed)
Devon Romero, [place] are scheduled for a virtual visit with your provider today.    Just as we do with appointments in the office, we must obtain your consent to participate.  Your consent will be active for this visit and any virtual visit you may have with one of our providers in the next 365 days.    If you have a MyChart account, I can also send a copy of this consent to you electronically.  All virtual visits are billed to your insurance company just like a traditional visit in the office.  As this is a virtual visit, video technology does not allow for your provider to perform a traditional examination.  This may limit your provider's ability to fully assess your condition.  If your provider identifies any concerns that need to be evaluated in person or the need to arrange testing such as labs, EKG, etc, we will make arrangements to do so.    Although advances in technology are sophisticated, we cannot ensure that it will always work on either your end or our end.  If the connection with a video visit is poor, we may have to switch to a telephone visit.  With either a video or telephone visit, we are not always able to ensure that we have a secure connection.   I need to obtain your verbal consent now.   Are you willing to proceed with your visit today?   Devon Romero has provided verbal consent on 08/20/2021 for a virtual visit (video or telephone).   Karrie Meres, [place] 08/20/2021  6:47 PM   Date:  08/20/2021   ID:  Devon Romero, DOB 1965/12/01, MRN [id number]  Patient Location: Home Provider Location: Home Office   Participants: Patient and Provider for Visit and Wrap up  Method of visit: Video  Location of Patient: Home Location of Provider: Home Office Consent was obtain for visit over the video. Services rendered by provider: Visit was performed via video  A video enabled telemedicine application was used and I verified that I am speaking with the correct person using  two identifiers.  PCP:  Devon Heys, MD   Chief Complaint:  COVID  History of Present Illness:    Devon Romero is a 56 y.o. male with history as stated below. Presents video telehealth for an acute care visit  Family at bedside assists with history. Family in the home recently tested positive for covid.  Pt tested positive for COVID. Pt became symptomatic from COVID today. He has had hiccups, rhinorrhea, cough.   Denies fevers, chills, sob, chest pain, nvd. Pt states that he feels great and his symptoms are minimal. Pt has been vaccinated for COVID  Past Medical, Surgical, Social History, Allergies, and Medications have been Reviewed.  Past Medical History:  Diagnosis Date   ED (erectile dysfunction)    Hyperlipidemia     No outpatient medications have been marked as taking for the 08/20/21 encounter (Appointment) with Devon Romero, Devon Romero S, PA-C.     Allergies:   Sildenafil   ROS See HPI for history of present illness.  Physical Exam Constitutional:      General: He is not in acute distress.    Appearance: He is well-developed.  Eyes:     Conjunctiva/sclera: Conjunctivae normal.  Pulmonary:     Effort: Pulmonary effort is normal.     Comments: Speaking in full sentences Skin:    General: Skin is warm and dry.  Neurological:  Mental Status: He is alert and oriented to person, place, and time.              There are no diagnoses linked to this encounter.   Time:   Today, I have spent 10 minutes with the patient with telehealth technology discussing the above problems, reviewing the chart, previous notes, medications and orders.    Tests Ordered: No orders of the defined types were placed in this encounter.   Medication Changes: No orders of the defined types were placed in this encounter.    Disposition:  Follow up  Signed, Karrie Meres, PA-C  08/20/2021 6:47 PM

## 2021-08-20 NOTE — Patient Instructions (Signed)
°  Devon Romero, thank you for joining Rodney Booze, PA-C for today's virtual visit.  While this provider is not your primary care provider (PCP), if your PCP is located in our provider database this encounter information will be shared with them immediately following your visit.  Consent: (Patient) Devon Romero provided verbal consent for this virtual visit at the beginning of the encounter.  Current Medications:  Current Outpatient Medications:    benzonatate (TESSALON) 100 MG capsule, Take 1 capsule (100 mg total) by mouth every 8 (eight) hours for 5 days., Disp: 15 capsule, Rfl: 0   molnupiravir EUA (LAGEVRIO) 200 mg CAPS capsule, Take 4 capsules (800 mg total) by mouth 2 (two) times daily for 5 days., Disp: 40 capsule, Rfl: 0   Apoaequorin (PREVAGEN) 10 MG CAPS, , Disp: , Rfl:    atorvastatin (LIPITOR) 40 MG tablet, Take 40 mg by mouth daily., Disp: , Rfl:    co-enzyme Q-10 30 MG capsule, Take 30 mg by mouth 3 (three) times daily., Disp: , Rfl:    Coenzyme Q10 (CO Q-10) 300 MG CAPS, 1 capsule with a meal, Disp: , Rfl:    donepezil (ARICEPT) 10 MG tablet, Take 1 tablet daily, Disp: 90 tablet, Rfl: 3   tadalafil (CIALIS) 20 MG tablet, Take by mouth., Disp: , Rfl:    Medications ordered in this encounter:  Meds ordered this encounter  Medications   molnupiravir EUA (LAGEVRIO) 200 mg CAPS capsule    Sig: Take 4 capsules (800 mg total) by mouth 2 (two) times daily for 5 days.    Dispense:  40 capsule    Refill:  0    Order Specific Question:   Supervising Provider    Answer:   MILLER, BRIAN [[id number]]   benzonatate (TESSALON) 100 MG capsule    Sig: Take 1 capsule (100 mg total) by mouth every 8 (eight) hours for 5 days.    Dispense:  15 capsule    Refill:  0    Order Specific Question:   Supervising Provider    Answer:   [name] Heck, [place]     *If you need refills on other medications prior to your next appointment, please contact your pharmacy*  Follow-Up: Call back  or seek an in-person evaluation if the symptoms worsen or if the condition fails to improve as anticipated.  Other Instructions Take molnupirovir and tessalon as directed   Follow up with your regular doctor in 1 week for reassessment and seek care sooner if your symptoms worsen or fail to improve.   If you have been instructed to have an in-person evaluation today at a local Urgent Care facility, please use the link below. It will take you to a list of all of our available [facility], including address, phone number and hours of operation. Please do not delay care.  [facility]  If you or a family member do not have a primary care provider, use the link below to schedule a visit and establish care. When you choose a [facility] primary care physician or advanced practice provider, you gain a long-term partner in health. Find a Primary Care Provider  Learn more about [facility]'s in-office and virtual care options: [facility]

## 2021-08-22 ENCOUNTER — Emergency Department (HOSPITAL_COMMUNITY)
Admission: EM | Admit: 2021-08-22 | Discharge: 2021-08-23 | Disposition: A | Discharge status: Home / Self Care | Payer: 59 | Attending: Emergency Medicine | Admitting: Emergency Medicine

## 2021-08-22 DIAGNOSIS — U071 COVID-19: Secondary | ICD-10-CM | POA: Insufficient documentation

## 2021-08-22 DIAGNOSIS — Z5321 Procedure and treatment not carried out due to patient leaving prior to being seen by health care provider: Secondary | ICD-10-CM | POA: Diagnosis not present

## 2021-08-22 DIAGNOSIS — M549 Dorsalgia, unspecified: Secondary | ICD-10-CM | POA: Insufficient documentation

## 2021-08-22 DIAGNOSIS — R319 Hematuria, unspecified: Secondary | ICD-10-CM | POA: Diagnosis not present

## 2021-08-22 LAB — COMPREHENSIVE METABOLIC PANEL
ALT: 19 U/L (ref 0–44)
AST: 45 U/L — ABNORMAL HIGH (ref 15–41)
Albumin: 3.6 g/dL (ref 3.5–5.0)
Alkaline Phosphatase: 59 U/L (ref 38–126)
Anion gap: 9 (ref 5–15)
BUN: 11 mg/dL (ref 6–20)
CO2: 27 mmol/L (ref 22–32)
Calcium: 8.5 mg/dL — ABNORMAL LOW (ref 8.9–10.3)
Chloride: 100 mmol/L (ref 98–111)
Creatinine, Ser: 1.74 mg/dL — ABNORMAL HIGH (ref 0.61–1.24)
GFR, Estimated: 45 mL/min — ABNORMAL LOW (ref >60)
Glucose, Bld: 122 mg/dL — ABNORMAL HIGH (ref 70–99)
Potassium: 3.3 mmol/L — ABNORMAL LOW (ref 3.5–5.1)
Sodium: 136 mmol/L (ref 135–145)
Total Bilirubin: 0.7 mg/dL (ref 0.3–1.2)
Total Protein: 6.5 g/dL (ref 6.5–8.1)

## 2021-08-22 LAB — CBC WITH DIFFERENTIAL/PLATELET
Abs Immature Granulocytes: 0.02 10*3/uL (ref 0.00–0.07)
Basophils Absolute: 0.1 10*3/uL (ref 0.0–0.1)
Basophils Relative: 1 %
Eosinophils Absolute: 0.6 10*3/uL — ABNORMAL HIGH (ref 0.0–0.5)
Eosinophils Relative: 9 %
HCT: 36.1 % — ABNORMAL LOW (ref 39.0–52.0)
Hemoglobin: 11.9 g/dL — ABNORMAL LOW (ref 13.0–17.0)
Immature Granulocytes: 0 %
Lymphocytes Relative: 43 %
Lymphs Abs: 3 10*3/uL (ref 0.7–4.0)
MCH: 30.6 pg (ref 26.0–34.0)
MCHC: 33 g/dL (ref 30.0–36.0)
MCV: 92.8 fL (ref 80.0–100.0)
Monocytes Absolute: 0.9 10*3/uL (ref 0.1–1.0)
Monocytes Relative: 13 %
Neutro Abs: 2.4 10*3/uL (ref 1.7–7.7)
Neutrophils Relative %: 34 %
Platelets: 168 10*3/uL (ref 150–400)
RBC: 3.89 MIL/uL — ABNORMAL LOW (ref 4.22–5.81)
RDW: 13.6 % (ref 11.5–15.5)
WBC: 6.9 10*3/uL (ref 4.0–10.5)
nRBC: 0 % (ref 0.0–0.2)

## 2021-08-22 NOTE — ED Provider Triage Note (Signed)
Emergency Medicine Provider Triage Evaluation Note  Devon Romero , a 56 y.o. male  was evaluated in triage.  Pt complains of painless hematuria. States that same began last night with bright red blood in the toilet without any visible clots. States that when he urinated this morning it was clear but then he noticed blood in his urine again later. Now states that blood is actively dripping out of his penis and getting on his underwear. Denies any history of similar events. Denies any abdominal pain or dysuria. Hx of smoking, however states that he stopped more than 10 years ago  Review of Systems  Positive: hematuria Negative: Dysuria, abdominal pain, fevers, chills  Physical Exam  BP (!) 141/90 (BP Location: Right Arm)    Pulse (!) 57    Temp 98.6 F (37 C) (Oral)    Resp 16    SpO2 100%  Gen:   Awake, no distress   Resp:  Normal effort  MSK:   Moves extremities without difficulty  Other:    Medical Decision Making  Medically screening exam initiated at 9:19 PM.  Appropriate orders placed.  DAVIDE RISDON was informed that the remainder of the evaluation will be completed by another provider, this initial triage assessment does not replace that evaluation, and the importance of remaining in the ED until their evaluation is complete.     Nestor Lewandowsky 08/22/21 2123

## 2021-08-22 NOTE — ED Triage Notes (Signed)
Pt c/o hematuria x1 night, denies associated abd/back pain. Pt & wife tested positive for covid, on antiviral for same. Denies pain, no additional physical symptoms.

## 2021-08-23 LAB — URINALYSIS, ROUTINE W REFLEX MICROSCOPIC
Bilirubin Urine: NEGATIVE
Glucose, UA: NEGATIVE mg/dL
Ketones, ur: NEGATIVE mg/dL
Leukocytes,Ua: NEGATIVE
Nitrite: NEGATIVE
Protein, ur: NEGATIVE mg/dL
Specific Gravity, Urine: 1.002 — ABNORMAL LOW (ref 1.005–1.030)
pH: 6 (ref 5.0–8.0)

## 2021-08-23 NOTE — ED Notes (Signed)
Pt was called for vitals x3 times with no answer.

## 2021-08-24 LAB — URINE CULTURE: Culture: NO GROWTH

## 2021-10-22 ENCOUNTER — Ambulatory Visit: Payer: 59 | Admitting: Physician Assistant

## 2021-10-22 ENCOUNTER — Encounter: Payer: Self-pay | Admitting: Physician Assistant

## 2021-10-22 VITALS — BP 144/91 | HR 49 | Ht 69.0 in | Wt 173.0 lb

## 2021-10-22 DIAGNOSIS — R413 Other amnesia: Secondary | ICD-10-CM

## 2021-10-22 DIAGNOSIS — F0280 Dementia in other diseases classified elsewhere without behavioral disturbance: Secondary | ICD-10-CM | POA: Diagnosis not present

## 2021-10-22 DIAGNOSIS — G300 Alzheimer's disease with early onset: Secondary | ICD-10-CM

## 2021-10-22 MED ORDER — MEMANTINE HCL 5 MG PO TABS: ORAL_TABLET | ORAL | 2 refills | Status: DC

## 2021-10-22 MED ORDER — DONEPEZIL HCL 10 MG PO TABS: ORAL_TABLET | ORAL | 3 refills | Status: DC

## 2021-10-22 NOTE — Patient Instructions (Addendum)
It was a pleasure to see you today at our office.  ? ?Recommendations: ? ?Meds: ?Follow up in  6 months ?Continue donepezil 10 mg daily.   ?Start Memantine '5mg'$  tablets.  Take 1 tablet at bedtime for 2 weeks, then 1 tablet twice daily.   Side effects include dizziness, headache, diarrhea or constipation.  Call if severe symptoms appear  ? ? ?RECOMMENDATIONS FOR ALL PATIENTS WITH MEMORY PROBLEMS: ?1. Continue to exercise (Recommend 30 minutes of walking everyday, or 3 hours every week) ?2. Increase social interactions - continue going to [place] and enjoy social gatherings with friends and family ?3. Eat healthy, avoid fried foods and eat more fruits and vegetables ?4. Maintain adequate blood pressure, blood sugar, and blood cholesterol level. Reducing the risk of stroke and cardiovascular disease also helps promoting better memory. ?5. Avoid stressful situations. Live a simple life and avoid aggravations. Organize your time and prepare for the next day in anticipation. ?6. Sleep well, avoid any interruptions of sleep and avoid any distractions in the bedroom that may interfere with adequate sleep quality ?7. Avoid sugar, avoid sweets as there is a strong link between excessive sugar intake, diabetes, and cognitive impairment ?We discussed the Mediterranean diet, which has been shown to help patients reduce the risk of progressive memory disorders and reduces cardiovascular risk. This includes eating fish, eat fruits and green leafy vegetables, nuts like almonds and hazelnuts, walnuts, and also use olive oil. Avoid fast foods and fried foods as much as possible. Avoid sweets and sugar as sugar use has been linked to worsening of memory function. ? ?There is always a concern of gradual progression of memory problems. If this is the case, then we may need to adjust level of care according to patient needs. Support, both to the patient and caregiver, should then be put into place.  ? ? ?The Alzheimer?s Association is here  all day, every day for people facing Alzheimer?s disease through our free 24/7 Helpline: [phone]. The Helpline provides reliable information and support to all those who need assistance, such as individuals living with memory loss, Alzheimer's or other dementia, caregivers, health care professionals and the public.  ?Our highly trained and knowledgeable staff can help you with: ?Understanding memory loss, dementia and Alzheimer's  ?Medications and other treatment options  ?General information about aging and brain health  ?Skills to provide quality care and to find the best care from professionals  ?Legal, financial and living-arrangement decisions ?Our Helpline also features: ?Confidential care consultation provided by master's level clinicians who can help with decision-making support, crisis assistance and education on issues families face every day  ?Help in a caller's preferred language using our translation service that features more than 200 languages and dialects  ?Referrals to local community programs, services and ongoing support ? ? ? ? ?FALL PRECAUTIONS: Be cautious when walking. Scan the area for obstacles that may increase the risk of trips and falls. When getting up in the mornings, sit up at the edge of the bed for a few minutes before getting out of bed. Consider elevating the bed at the head end to avoid drop of blood pressure when getting up. Walk always in a well-lit room (use night lights in the walls). Avoid area rugs or power cords from appliances in the middle of the walkways. Use a walker or a cane if necessary and consider physical therapy for balance exercise. Get your eyesight checked regularly. ? ?FINANCIAL OVERSIGHT: Supervision, especially oversight when making financial decisions or  transactions is also recommended. ? ?HOME SAFETY: Consider the safety of the kitchen when operating appliances like stoves, microwave oven, and blender. Consider having supervision and share cooking  responsibilities until no longer able to participate in those. Accidents with firearms and other hazards in the house should be identified and addressed as well. ? ? ?ABILITY TO BE LEFT ALONE: If patient is unable to contact 911 operator, consider using LifeLine, or when the need is there, arrange for someone to stay with patients. Smoking is a fire hazard, consider supervision or cessation. Risk of wandering should be assessed by caregiver and if detected at any point, supervision and safe proof recommendations should be instituted. ? ?MEDICATION SUPERVISION: Inability to self-administer medication needs to be constantly addressed. Implement a mechanism to ensure safe administration of the medications. ? ? ?DRIVING: Regarding driving, in patients with progressive memory problems, driving will be impaired. We advise to have someone else do the driving if trouble finding directions or if minor accidents are reported. Independent driving assessment is available to determine safety of driving. ? ? ?If you are interested in the driving assessment, you can contact the following: ? ?[employer] in [place] ? ?[place] [phone] ? ?[facility] [phone] ? ?[facility] [phone] or [phone] ? ?  ? ? ?Mediterranean Diet ?A Mediterranean diet refers to food and lifestyle choices that are based on the traditions of countries located on the [employer]. This way of eating has been shown to help prevent certain conditions and improve outcomes for people who have chronic diseases, like kidney disease and heart disease. ?What are tips for following this plan? ?Lifestyle  ?Cook and eat meals together with your family, when possible. ?Drink enough fluid to keep your urine clear or pale yellow. ?Be physically active every day. This includes: ?Aerobic exercise like running or swimming. ?Leisure activities like gardening, walking, or housework. ?Get 7-8  hours of sleep each night. ?If recommended by your health care provider, drink red wine in moderation. This means 1 glass a day for nonpregnant women and 2 glasses a day for men. A glass of wine equals 5 oz (150 mL). ?Reading food labels  ?Check the serving size of packaged foods. For foods such as rice and pasta, the serving size refers to the amount of cooked product, not dry. ?Check the total fat in packaged foods. Avoid foods that have saturated fat or trans fats. ?Check the ingredients list for added sugars, such as corn syrup. ?Shopping  ?At the grocery store, buy most of your food from the areas near the walls of the store. This includes: ?Fresh fruits and vegetables (produce). ?Grains, beans, nuts, and seeds. Some of these may be available in unpackaged forms or large amounts (in bulk). ?Fresh seafood. ?Poultry and eggs. ?Low-fat dairy products. ?Buy whole ingredients instead of prepackaged foods. ?Buy fresh fruits and vegetables in-season from local farmers markets. ?Buy frozen fruits and vegetables in resealable bags. ?If you do not have access to quality fresh seafood, buy precooked frozen shrimp or canned fish, such as tuna, salmon, or sardines. ?Buy small amounts of raw or cooked vegetables, salads, or olives from the deli or salad bar at your store. ?Stock your pantry so you always have certain foods on hand, such as olive oil, canned tuna, canned tomatoes, rice, pasta, and beans. ?Cooking  ?Cook foods with extra-virgin olive oil instead of using butter or other vegetable oils. ?Have meat as a side dish, and have vegetables or  grains as your main dish. This means having meat in small portions or adding small amounts of meat to foods like pasta or stew. ?Use beans or vegetables instead of meat in common dishes like chili or lasagna. ?Experiment with different cooking methods. Try roasting or broiling vegetables instead of steaming or saut?eing them. ?Add frozen vegetables to soups, stews, pasta, or  rice. ?Add nuts or seeds for added healthy fat at each meal. You can add these to yogurt, salads, or vegetable dishes. ?Marinate fish or vegetables using olive oil, lemon juice, garlic, and fresh herbs.

## 2021-10-22 NOTE — Progress Notes (Signed)
? ?Assessment/Plan:  ? 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 ?In today's visit, some cognitive decline is noted.  Today's MMSE is 17/30, with delayed recall 0/3.  He was unable to spell, and he had difficulty drawing a picture.  In addition, he had trouble with date, the year is 2022.  Patient is currently on donepezil 10 mg daily, tolerating well. ? ?Recommendations ? ?Discussed safety both in and out of the home. Discussed the importance of regular daily schedule with inclusion of crossword puzzles to maintain brain function.  ?Continue to stay active exercising  at least 30 minutes at least 3 times a week.  ?Naps should be scheduled and should be no longer than 60 minutes and should not occur after 2 PM.  ?Mediterranean diet is recommended. Book with pertinent information regarding this diet was given. ?Continue donepezil 10 mg daily.  Refill was sent to the pharmacy ?Start memantine 5 mg, take 1 p.o. nightly, for 2 weeks, then increase to 5 mg twice daily (may need to increase the dose pending on the Neuropsych evaluation) ?Repeat Neuropsych evaluation. ?They are fully control his cardiovascular risk factors ?Follow-up in 6 months, he knows to call if he has any questions or concerns. ? ?Case discussed with Dr. Delice Romero who agrees with the plan ? ? ? ? ?Subjective:  ? ?Devon Romero was seen today in follow up for memory loss. He is a 56 yo right-handed man, with a history of hyperlipidemia, hypertension, hypothyroidism, history of prostate cancer s/p prostatectomy, initially presenting for memory loss in May 2021.  At the time, MRI of the brain showed mild atrophy, advanced for age.  A neuropsychological evaluation indicated a diagnosis of early onset of Alzheimer's disease.  Lumbar puncture was negative for infection or inflammation, but did show changes that provided additional information of early onset of AD. He was started on donepezil 10 mg daily on 03/14/20 and tolerating  it well.   This patient is accompanied in the office by his fianc? Devon Romero who supplements the history.  My previous records as well as any outside records available were reviewed prior to todays visit.  Patient is currently on donepezil 10 mg a day. Patient reports that overall he is doing well.  Although he states that his memory is about the same, his fianc?e reports that "is not as good as last time ".  His memory is better during the daytime, but she does notice that he repeats himself more than prior.  He had COVID in February 2023, and initially she thought that the changes were related to the disease, but his cognitive status never improved.  He continues to remain active.  He walks, uses a stationary bicycle for at least 30 minutes a day, and also uses the jump rope at least for 200 jumps. He eats well and denies trouble swallowing.  He admits to not drinking enough water.  He denies any headaches, dizziness, vision changes, dysarthria, dysphagia, neck or back pain.  He denies any urinary or bowel incontinence.  He denies any focal numbness, tingling or weaknesses. He denies any recent falls.  No hallucinations, vivid dreams or paranoia is reported in his fianc?e agrees.  He cooks occasionally, but denies leaving the stove on, or the faucet on.  He dresses himself and showers daily.  Devon Romero pays the bills.   His mood is good, no irritability reported. He denies any depression or anxiety.  ? ? ?"History on Initial Assessment 11/23/2019: This is a pleasant  56 year old right-handed man with a history of hyperlipidemia presenting for evaluation of memory loss. His fiancee Devon Romero is present to provide additional information. They have lived together for 25 years. He reports memory changes started around 2 years ago. He was laid off work in 2016 and did not work for 2 years. When he came back to work in 2018 in [employer], he kept asking things repeatedly. He used to have a keen memory, however now  co-workers were telling him he was repeating questions. He states memory issues did not affect his job, he was working there until he was furloughed in 2020. Devon Romero started noticing minor forgetfulness around that time, however in the past year, symptoms have become more noticeable. He continues to ask the same questions repeatedly. He would not remember something that had occurred, for instance he did not remember leaving the stove on this morning, which was the first time he had done this. He miss a bill last month, he has been forgetting passwords, and found a past due bill because it was not set up on autopay. She states he is usually good with money so this has never happened in the past, he has great credit. He has gotten lost driving a couple of months ago, then a couple of weeks ago he had an appointment to get his taxes done and did not know where to exit. He is only on Lipitor on a regular basis and denies missing medication. He misplaces things at home, which is not new. No family history of dementia. He denies any alcohol use. He had a concussion at age 85 on a tubing trip where he had amnesia for a day. He appears quiet and slightly withdrawn in the office, however they both report that his mood is good, "I'm happy." No paranoia or hallucinations. Devon Romero reports that he has calmed down and has become "more tolerant" this past year, he does not get as upset when she tells him he asked something already. Sleep is good, she denies any REM behavior disorder. He denies any headaches, dizziness, vision changes, dysarthria/dysphagia, neck/back pain, focal numbness/tingling/weakness, bowel/bladder dysfunction, anosmia, or tremors. No falls.  ?  ?  ?Laboratory Data: ?06/2019: TSH 4.76, [id number] [id number] " ? ?PREVIOUS MEDICATIONS: None ? ?CURRENT MEDICATIONS:  ?Outpatient Encounter Medications as of 10/22/2021  ?Medication Sig  ? Apoaequorin (PREVAGEN) 10 MG CAPS   ? atorvastatin (LIPITOR) 40 MG tablet Take 40 mg by mouth  daily.  ? co-enzyme Q-10 30 MG capsule Take 30 mg by mouth 3 (three) times daily.  ? Coenzyme Q10 (CO Q-10) 300 MG CAPS 1 capsule with a meal  ? memantine (NAMENDA) 5 MG tablet Take 1 tablet (5 mg at night) for 2 weeks, then increase to 1 tablet (5 mg) twice a day  ? tadalafil (CIALIS) 20 MG tablet Take by mouth.  ? [DISCONTINUED] donepezil (ARICEPT) 10 MG tablet Take 1 tablet daily  ? donepezil (ARICEPT) 10 MG tablet Take 1 tablet daily  ? ?No facility-administered encounter medications on file as of 10/22/2021.  ? ? ? ?Objective:  ?  ? ?PHYSICAL EXAMINATION:   ? ?VITALS:   ?Vitals:  ? 10/22/21 1114  ?BP: (!) 144/91  ?Pulse: (!) 49  ?SpO2: 100%  ?Weight: 173 lb (78.5 kg)  ?Height: '5\' 9"'$  (1.753 m)  ? ? ? ?  10/22/2021  ?  7:00 PM 12/02/2019  ?  8:00 AM  ?MMSE - Mini Mental State Exam  ?Orientation to time 0  5  ?Orientation to Place 5 5  ?Registration 3 3  ?Attention/ Calculation 1 4  ?Recall 0 0  ?Language- name 2 objects 2 2  ?Language- repeat 1 0  ?Language- follow 3 step command 3 3  ?Language- read & follow direction 1 1  ?Write a sentence 1 0  ?Copy design 0 1  ?Total score 17 24  ?  ? ? ?GEN:  The patient appears stated age and is in NAD. Pulse is low, but asymptomatic, he is athlete. ?HEENT:  Normocephalic, atraumatic.  ? ?Neurological examination: ? ?Orientation: The patient is alert. Oriented to person, place but not to time  [facility] of knowledge is appropriate.   ?Cranial nerves: There is good facial symmetry.The speech is fluent and clear.  No aphasia or dysarthria.  Hearing is intact to conversational tone.  Delayed recall 0/3 ?Sensation: Sensation is intact to light touch throughout ?Motor: Strength is at least antigravity x4. ? ?Movement examination: ?Tone: There is normal tone in the UE/LE ?Abnormal movements:  no tremor.  No myoclonus.  No asterixis.   ?Coordination:  There is no decremation with RAM's. ?Gait and Station: The patient has no difficulty arising out of a deep-seated chair without the use of  the hands. The patient's stride length is good.   ? ? ? ?Total time spent on today's visit was 40 minutes, including both face-to-face time and nonface-to-face time.  Time included that spent on review of rec

## 2022-02-18 ENCOUNTER — Encounter: Payer: Self-pay | Admitting: Psychology

## 2022-04-23 ENCOUNTER — Encounter: Payer: Self-pay | Admitting: Physician Assistant

## 2022-04-23 ENCOUNTER — Telehealth: Payer: 59 | Admitting: Physician Assistant

## 2022-04-23 VITALS — BP 155/87 | HR 63 | Resp 18 | Ht 69.0 in | Wt 174.0 lb

## 2022-04-23 DIAGNOSIS — F0280 Dementia in other diseases classified elsewhere without behavioral disturbance: Secondary | ICD-10-CM | POA: Diagnosis not present

## 2022-04-23 DIAGNOSIS — G300 Alzheimer's disease with early onset: Secondary | ICD-10-CM

## 2022-04-23 DIAGNOSIS — R413 Other amnesia: Secondary | ICD-10-CM

## 2022-04-23 MED ORDER — MEMANTINE HCL 10 MG PO TABS: ORAL_TABLET | ORAL | 4 refills | Status: DC

## 2022-04-23 MED ORDER — DONEPEZIL HCL 10 MG PO TABS: ORAL_TABLET | ORAL | 3 refills | Status: DC

## 2022-04-23 NOTE — Progress Notes (Signed)
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 RH male with a history of hyperlipidemia, hypertension, hypothyroidism, history of prostate cancer s/p prostatectomy seen today in follow up for memory loss.  He carries a diagnosis of early onset Alzheimer's dementia.   MRI brain personally reviewed was remarkable for mild atrophy, advanced for age.  Neuropsych evaluation indicated the diagnoses above.  Lumbar puncture was negative for infection or inflammation, but did show changes providing additional information or early onset of Alzheimer's disease. He is on donepezil 10 mg daily and was supposed to be on memantine 5 mg twice daily, but has been taking on it at night dose because he was forgetting to take the morning meds.    Follow up in 6  months. Continue donepezil 10 mg daily, side effects discussed Continue memantine, but given the fact that he forgets the morning dose, will then take 10 mg at night, given the fact that he may show slight change in his cognitive status. Referral to [facility] for second opinion, additional therapeutic options giving his age. Recommend adult day program to increase activity.    Subjective:    This patient is accompanied in the office by his fiancee who supplements the history.  Previous records as well as any outside records available were reviewed prior to todays visit. Last seen on 10/22/21    Any changes in memory since last visit? No changes that I know of per patient, although his fiance reports that it may be "slightly worse ".  He is not very active during the day.  His memory is better during the day, worse at night. repeats oneself?  Endorsed Disoriented when walking into a room?  Patient denies   Leaving objects in unusual places?  Patient denies   Ambulates  with difficulty?   Patient denies   Recent falls?  Patient denies   Any head  injuries?  Patient denies   History of seizures?   Patient denies   Wandering behavior?  Patient denies   Patient drives?   Patient no longer drives for the last 3 months "because I don't have where to go" Any mood changes since last visit?  Patient denies   Any worsening depression?:  Patient denies   Hallucinations?  Patient denies   Paranoia?  Patient denies   Patient reports that sleeps well without vivid dreams, REM behavior or sleepwalking   History of sleep apnea?  Patient denies   Any hygiene concerns?  Patient denies   Independent of bathing and dressing?  Endorsed  Does the patient needs help with medications? Fiancee in charge  Who is in charge of the finances?  [name] is in charge.  He has not been taking the morning dose of memantine, because his wife cannot give it to him since she is working. Any changes in appetite?  Patient denies, although "he has not lost any weight"  Patient have trouble swallowing? Patient denies   Does the patient cook? Endorsed Any kitchen accidents such as leaving the stove on? Patient denies   Any headaches?  Patient denies   Double vision? Patient denies   Any focal numbness or tingling?  Patient denies   Chronic back pain Patient denies   Unilateral weakness?  Patient denies   Any tremors?  Patient denies   Any history of anosmia?  Patient denies   Any incontinence of urine?  Patient denies  Any bowel dysfunction?   Patient denies      Patient lives with: fiancee   PREVIOUS MEDICATIONS:   CURRENT MEDICATIONS:  Outpatient Encounter Medications as of 04/23/2022  Medication Sig   Apoaequorin (PREVAGEN) 10 MG CAPS    atorvastatin (LIPITOR) 40 MG tablet Take 40 mg by mouth daily.   co-enzyme Q-10 30 MG capsule Take 30 mg by mouth 3 (three) times daily.   Coenzyme Q10 (CO Q-10) 300 MG CAPS 1 capsule with a meal   donepezil (ARICEPT) 10 MG tablet Take 1 tablet daily   memantine (NAMENDA) 5 MG tablet Take 1 tablet (5 mg at night) for 2  weeks, then increase to 1 tablet (5 mg) twice a day   tadalafil (CIALIS) 20 MG tablet Take by mouth.   No facility-administered encounter medications on file as of 04/23/2022.       04/23/2022   11:00 AM 10/22/2021    7:00 PM 12/02/2019    8:00 AM  MMSE - Mini Mental State Exam  Orientation to time 0 0 5  Orientation to Place '5 5 5  '$ Registration '3 3 3  '$ Attention/ Calculation '1 1 4  '$ Recall 0 0 0  Language- name 2 objects '2 2 2  '$ Language- repeat 1 1 0  Language- follow 3 step command '3 3 3  '$ Language- read & follow direction '1 1 1  '$ Write a sentence 1 1 0  Copy design 0 0 1  Total score '17 17 24       '$ No data to display          Objective:     PHYSICAL EXAMINATION:    VITALS:   Vitals:   04/23/22 1104  BP: (!) 155/87  Pulse: 63  Resp: 18  SpO2: 95%  Weight: 174 lb (78.9 kg)  Height: '5\' 9"'$  (1.753 m)    GEN:  The patient appears stated age and is in NAD. HEENT:  Normocephalic, atraumatic.   Neurological examination:  General: NAD, well-groomed, appears stated age. Orientation: The patient is alert. Oriented to person, place, not to date. Cranial nerves: There is good facial symmetry.The speech is fluent and clear. No aphasia or dysarthria. Fund of knowledge is appropriate. Recent and remote memory are impaired. Attention and concentration are reduced.  Able to name objects and repeat phrases.  Hearing is intact to conversational tone.   Delayed recall 0/3. Sensation: Sensation is intact to light touch throughout Motor: Strength is at least antigravity x4. Tremors: none  DTR's 2/4 in UE/LE     Movement examination: Tone: There is normal tone in the UE/LE Abnormal movements:  no tremor.  No myoclonus.  No asterixis.   Coordination:  There is no decremation with RAM's. Normal finger to nose  Gait and Station: The patient has no difficulty arising out of a deep-seated chair without the use of the hands. The patient's stride length is good.  Gait is cautious and  narrow.    Thank you for allowing [place] the opportunity to participate in the care of this nice patient. Please do not hesitate to contact us for any questions or concerns.   Total time spent on today's visit was 20 minutes dedicated to this patient today, preparing to see patient, examining the patient, ordering tests and/or medications and counseling the patient, documenting clinical information in the EHR or other health record, independently interpreting results and communicating results to the patient/family, discussing treatment and goals, answering patient's questions and coordinating care.  Cc:  Gaynelle Arabian, MD  Sharene Butters 04/23/2022 11:22 AM

## 2022-04-23 NOTE — Patient Instructions (Addendum)
It was a pleasure to see you today at our office.   Recommendations:  Meds: Follow up in  6 months Continue donepezil 10 mg daily.   Continue  Memantine 10 mg tablets.  Take 1 tablet at bedtime  Referral to [facility] for second opinion  Repeat Neuropsych testing for clarity of diagnosis and disease trajectory      RECOMMENDATIONS FOR ALL PATIENTS WITH MEMORY PROBLEMS: 1. Continue to exercise (Recommend 30 minutes of walking everyday, or 3 hours every week) 2. Increase social interactions - continue going to [place] and enjoy social gatherings with friends and family 3. Eat healthy, avoid fried foods and eat more fruits and vegetables 4. Maintain adequate blood pressure, blood sugar, and blood cholesterol level. Reducing the risk of stroke and cardiovascular disease also helps promoting better memory. 5. Avoid stressful situations. Live a simple life and avoid aggravations. Organize your time and prepare for the next day in anticipation. 6. Sleep well, avoid any interruptions of sleep and avoid any distractions in the bedroom that may interfere with adequate sleep quality 7. Avoid sugar, avoid sweets as there is a strong link between excessive sugar intake, diabetes, and cognitive impairment We discussed the Mediterranean diet, which has been shown to help patients reduce the risk of progressive memory disorders and reduces cardiovascular risk. This includes eating fish, eat fruits and green leafy vegetables, nuts like almonds and hazelnuts, walnuts, and also use olive oil. Avoid fast foods and fried foods as much as possible. Avoid sweets and sugar as sugar use has been linked to worsening of memory function.  There is always a concern of gradual progression of memory problems. If this is the case, then we may need to adjust level of care according to patient needs. Support, both to the patient and caregiver, should then be put into place.    The Alzheimer's Association is here all day, every day  for people facing Alzheimer's disease through our free 24/7 Helpline: 612-360-1811. The Helpline provides reliable information and support to all those who need assistance, such as individuals living with memory loss, Alzheimer's or other dementia, caregivers, health care professionals and the public.  Our highly trained and knowledgeable staff can help you with: Understanding memory loss, dementia and Alzheimer's  Medications and other treatment options  General information about aging and brain health  Skills to provide quality care and to find the best care from professionals  Legal, financial and living-arrangement decisions Our Helpline also features: Confidential care consultation provided by master's level clinicians who can help with decision-making support, crisis assistance and education on issues families face every day  Help in a caller's preferred language using our translation service that features more than 200 languages and dialects  Referrals to local community programs, services and ongoing support     FALL PRECAUTIONS: Be cautious when walking. Scan the area for obstacles that may increase the risk of trips and falls. When getting up in the mornings, sit up at the edge of the bed for a few minutes before getting out of bed. Consider elevating the bed at the head end to avoid drop of blood pressure when getting up. Walk always in a well-lit room (use night lights in the walls). Avoid area rugs or power cords from appliances in the middle of the walkways. Use a walker or a cane if necessary and consider physical therapy for balance exercise. Get your eyesight checked regularly.  FINANCIAL OVERSIGHT: Supervision, especially oversight when making financial decisions or transactions is  also recommended.  HOME SAFETY: Consider the safety of the kitchen when operating appliances like stoves, microwave oven, and blender. Consider having supervision and share cooking responsibilities  until no longer able to participate in those. Accidents with firearms and other hazards in the house should be identified and addressed as well.   ABILITY TO BE LEFT ALONE: If patient is unable to contact 911 operator, consider using LifeLine, or when the need is there, arrange for someone to stay with patients. Smoking is a fire hazard, consider supervision or cessation. Risk of wandering should be assessed by caregiver and if detected at any point, supervision and safe proof recommendations should be instituted.  MEDICATION SUPERVISION: Inability to self-administer medication needs to be constantly addressed. Implement a mechanism to ensure safe administration of the medications.   DRIVING: Regarding driving, in patients with progressive memory problems, driving will be impaired. We advise to have someone else do the driving if trouble finding directions or if minor accidents are reported. Independent driving assessment is available to determine safety of driving.   If you are interested in the driving assessment, you can contact the following:  [employer] in [place]  [place] [phone] or [phone]      [place] refers to food and lifestyle choices that are based on the traditions of countries located on the [employer]. This way of eating has been shown to help prevent certain conditions and improve outcomes for people who have chronic diseases, like kidney disease and heart disease. What are tips for following this plan? Lifestyle  Cook and eat meals together with your family, when possible. Drink enough fluid to keep your urine clear or pale yellow. Be physically active every day. This includes: Aerobic exercise like running or swimming. Leisure activities like gardening, walking, or housework. Get 7-8 hours of sleep  each night. If recommended by your health care provider, drink red wine in moderation. This means 1 glass a day for nonpregnant women and 2 glasses a day for men. A glass of wine equals 5 oz (150 mL). Reading food labels  Check the serving size of packaged foods. For foods such as rice and pasta, the serving size refers to the amount of cooked product, not dry. Check the total fat in packaged foods. Avoid foods that have saturated fat or trans fats. Check the ingredients list for added sugars, such as corn syrup. Shopping  At the grocery store, buy most of your food from the areas near the walls of the store. This includes: Fresh fruits and vegetables (produce). Grains, beans, nuts, and seeds. Some of these may be available in unpackaged forms or large amounts (in bulk). Fresh seafood. Poultry and eggs. Low-fat dairy products. Buy whole ingredients instead of prepackaged foods. Buy fresh fruits and vegetables in-season from local farmers markets. Buy frozen fruits and vegetables in resealable bags. If you do not have access to quality fresh seafood, buy precooked frozen shrimp or canned fish, such as tuna, salmon, or sardines. Buy small amounts of raw or cooked vegetables, salads, or olives from the deli or salad bar at your store. Stock your pantry so you always have certain foods on hand, such as olive oil, canned tuna, canned tomatoes, rice, pasta, and beans. Cooking  Cook foods with extra-virgin olive oil instead of using butter or other vegetable oils. Have meat as a side dish, and have vegetables or grains as  your main dish. This means having meat in small portions or adding small amounts of meat to foods like pasta or stew. Use beans or vegetables instead of meat in common dishes like chili or lasagna. Experiment with different cooking methods. Try roasting or broiling vegetables instead of steaming or sauteing them. Add frozen vegetables to soups, stews, pasta, or rice. Add nuts or  seeds for added healthy fat at each meal. You can add these to yogurt, salads, or vegetable dishes. Marinate fish or vegetables using olive oil, lemon juice, garlic, and fresh herbs. Meal planning  Plan to eat 1 vegetarian meal one day each week. Try to work up to 2 vegetarian meals, if possible. Eat seafood 2 or more times a week. Have healthy snacks readily available, such as: Vegetable sticks with hummus. Greek yogurt. Fruit and nut trail mix. Eat balanced meals throughout the week. This includes: Fruit: 2-3 servings a day Vegetables: 4-5 servings a day Low-fat dairy: 2 servings a day Fish, poultry, or lean meat: 1 serving a day Beans and legumes: 2 or more servings a week Nuts and seeds: 1-2 servings a day Whole grains: 6-8 servings a day Extra-virgin olive oil: 3-4 servings a day Limit red meat and sweets to only a few servings a month What are my food choices? Mediterranean diet Recommended Grains: Whole-grain pasta. Brown rice. Bulgar wheat. Polenta. Couscous. Whole-wheat bread. [place]. Vegetables: Artichokes. Beets. Broccoli. Cabbage. Carrots. Eggplant. Green beans. Chard. Kale. Spinach. Onions. Leeks. Peas. Squash. Tomatoes. Peppers. Radishes. Fruits: Apples. Apricots. Avocado. Berries. Bananas. Cherries. Dates. Figs. Grapes. Lemons. Melon. Oranges. Peaches. Plums. Pomegranate. Meats and other protein foods: Beans. Almonds. Sunflower seeds. Pine nuts. Peanuts. [place]. Salmon. Scallops. Shrimp. [place]. Tilapia. Clams. Oysters. Eggs. Dairy: Low-fat milk. Cheese. Greek yogurt. Beverages: Water. Red wine. Herbal tea. Fats and oils: Extra virgin olive oil. Avocado oil. Grape seed oil. Sweets and desserts: [place] yogurt with honey. Baked apples. Poached pears. Trail mix. Seasoning and other foods: Basil. Cilantro. Coriander. Cumin. Mint. Parsley. Sage. Rosemary. Tarragon. Garlic. Oregano. Thyme. Pepper. Balsalmic vinegar. Tahini. Hummus. Tomato sauce. Olives. Mushrooms. Limit  these Grains: Prepackaged pasta or rice dishes. Prepackaged cereal with added sugar. Vegetables: Deep fried potatoes (french fries). Fruits: Fruit canned in syrup. Meats and other protein foods: Beef. Pork. Lamb. Poultry with skin. Hot dogs. [place] Salines. Dairy: Ice cream. Sour cream. Whole milk. Beverages: Juice. Sugar-sweetened soft drinks. Beer. Liquor and spirits. Fats and oils: Butter. [place]ola oil. Vegetable oil. Beef fat (tallow). Lard. Sweets and desserts: Cookies. Cakes. Pies. Candy. Seasoning and other foods: Mayonnaise. Premade sauces and marinades. The items listed may not be a complete list. Talk with your dietitian about what dietary choices are right for you. Summary The Mediterranean diet includes both food and lifestyle choices. Eat a variety of fresh fruits and vegetables, beans, nuts, seeds, and whole grains. Limit the amount of red meat and sweets that you eat. Talk with your health care provider about whether it is safe for you to drink red wine in moderation. This means 1 glass a day for nonpregnant women and 2 glasses a day for men. A glass of wine equals 5 oz (150 mL). This information is not intended to replace advice given to you by your health care provider. Make sure you discuss any questions you have with your health care provider. Document Released: 02/14/2016 Document Revised: 03/18/2016 Document Reviewed: 02/14/2016 Elsevier Interactive Patient Education  2017 [employer].

## 2022-06-24 ENCOUNTER — Ambulatory Visit (INDEPENDENT_AMBULATORY_CARE_PROVIDER_SITE_OTHER): Payer: 59 | Admitting: Psychology

## 2022-06-24 ENCOUNTER — Encounter: Payer: Self-pay | Admitting: Psychology

## 2022-06-24 ENCOUNTER — Ambulatory Visit: Payer: 59 | Admitting: Psychology

## 2022-06-24 DIAGNOSIS — F0280 Dementia in other diseases classified elsewhere without behavioral disturbance: Secondary | ICD-10-CM

## 2022-06-24 DIAGNOSIS — R4189 Other symptoms and signs involving cognitive functions and awareness: Secondary | ICD-10-CM

## 2022-06-24 DIAGNOSIS — G300 Alzheimer's disease with early onset: Secondary | ICD-10-CM

## 2022-06-24 DIAGNOSIS — E785 Hyperlipidemia, unspecified: Secondary | ICD-10-CM | POA: Insufficient documentation

## 2022-06-24 NOTE — Progress Notes (Unsigned)
NEUROPSYCHOLOGICAL EVALUATION [place] [place] [facility] of Neurology  Date of Evaluation: June 24, 2022  Reason for Referral:   Devon Romero is a 56 y.o. right-handed African-American male referred by Sharene Butters, PA-C, to characterize his current cognitive functioning and assist with diagnostic clarity and treatment planning in the context of prior concerns surrounding an early-onset Alzheimer's disease presentation and concern for progressive decline.   Assessment and Plan:   Clinical Impression(s): Mr. Devon Romero pattern of performance is suggestive of diffuse cognitive dysfunction. He performed well relative to age-matched peers across phonemic fluency and an isolated task assessing abstract reasoning. However, all other assessed domains exhibited severe impairment. This includes processing speed, attention/concentration, executive functioning (outside of abstract reasoning), safety/judgment, receptive language, semantic fluency, confrontation naming, visuospatial abilities, and all aspects of learning and memory. Regarding day-to-day functioning, Mr. Devon Romero fiance has fully taken over all medication, financial, and bill paying responsibilities. He also no longer drives and medical records suggest several prior instances where he got lost. Given cognitive and functional impairment, he continues to meet diagnostic criteria for a Major Neurocognitive Disorder ("dementia") at the present time.  Relative to his previous evaluation in May 2021, significant performance declines were observed across processing speed, attention/concentration, cognitive flexibility, semantic fluency, and confrontation naming. A more modest decline was noted across visuospatial abilities. Memory performances were stable across the two evaluations in that they both exhibited severe impairments with amnestic patterns and poor recognition performances.   Regarding etiology, I agree  with prior concerns raised by Dr. Nicole Kindred surrounding early-onset Alzheimer's disease. Neuroimaging suggesting advanced temporal lobe atrophy, as well as his lumbar puncture suggesting pathological changes consistent with this illness, are certainly very worrisome. Across memory tasks, he did not benefit from repeated exposure to information, was essentially amnestic after brief delays, and performed poorly across yes/no recognition trials. Taken together, this suggests rapid forgetting and a prominent memory storage impairment, both of which are hallmark characteristics of this illness. Progressive decline is another very worrisome feature of his current clinical presentation. While certainly a very rare presentation given his overall age, this continues to appear the best explanation for ongoing cognitive and functional decline. Continued medical monitoring will be important moving forward.   Recommendations: Unless there is suspicion of improvement, repeat testing is not recommended given the extent of significant cognitive impairment.   Mr. Devon Romero has already been prescribed several medications aimed to address memory loss and concerns surrounding Alzheimer's disease (i.e., donepezil/Aricept and memantine/Namenda). He is encouraged to continue taking this medication as prescribed. It is important to highlight that these medications have been shown to slow functional decline in some individuals. There is no current treatment which can stop or reverse cognitive decline when caused by a neurodegenerative illness.   I agree with Mr. Devon Romero medical team and fiance having him fully abstain from all driving pursuits based upon current testing.   It will be important for Mr. Devon Romero to have another person with him when in situations where he may need to process information, weigh the pros and cons of different options, and make decisions, in order to ensure that he fully understands and recalls all  information to be considered.  If not already done, he and his family may want to discuss his wishes regarding durable power of attorney and medical decision making, so that he can have input into these choices. Additionally, they may wish to discuss future plans for caretaking and seek out community options for in home/residential  care should they become necessary.  Mr. Devon Romero is encouraged to attend to lifestyle factors for brain health (e.g., regular physical exercise, good nutrition habits, regular participation in cognitively-stimulating activities, and general stress management techniques), which are likely to have benefits for both emotional adjustment and cognition. Optimal control of vascular risk factors (including safe cardiovascular exercise and adherence to dietary recommendations) is encouraged. Continued participation in activities which provide mental stimulation and social interaction is also recommended.   Important information should be provided to Mr. Devon Romero in written format in all instances. This information should be placed in a highly frequented and easily visible location within his home to promote recall. External strategies such as written notes in a consistently used memory journal, visual and nonverbal auditory cues such as a calendar on the refrigerator or appointments with alarm, such as on a cell phone, can also help maximize recall.  Review of Records:   Mr. Devon Romero was seen by [facility] [name], M.D.) on 11/23/2019 for an evaluation of memory loss. Memory decline was first noted approximately two years prior. Examples included asking repetitive questions or making repetitive statements, trouble remembering details of past events (e.g., leaving the stove on), trouble remembering to pay bills, and trouble recalling passwords. There was also mention of him getting lost while driving several weeks prior. He was laid off work in 2016 and did not work for two  years. When he got a new job in 2018, his co-workers noted that he would ask repetitive questions. Performance on a brief cognitive screening instrument (SLUMS) was 11/30.   He completed a comprehensive neuropsychological evaluation Alphonzo Severance, Psy.D.) on 12/01/2019. Results suggested significant cognitive impairment with particular areas of difficulty surrounding memory storage, executive dysfunction, and visuospatial/constructional abilities. His presentation was said to be "highly concerning" for early onset dementia, most likely due to Alzheimer's disease given memory performances. Active monitoring was recommended.   He followed-up with Dr Delice Lesch on 03/14/2020. Symptoms were described as stable at that time. He was started on donepezil. A lumbar puncture was also discussed. This was completed on 03/26/2020 with results suggesting an elevated p-Tau/Abeta42 ratio, said to be consistent with pathological changes associated with Alzheimer's disease.   He was most recently seen by [facility] Sharene Butters, PA-C) on 04/23/2022 for follow-up. Symptoms were described as stable. In the interim, he had been started on memantine as well as remaining on donepezil. He had stopped driving due to previous instances of getting lost and his fiance had fully taken over all medication, financial, and bill paying responsibilities. Performance on a brief cognitive screening instrument (MMSE) was 17/30. Ultimately, Mr. Garris was referred for a repeat neuropsychological evaluation to characterize his cognitive abilities and to assist with diagnostic clarity and treatment planning.   Brain MRI on 12/03/2019 revealed mild atrophy per the neuroradiologist. Upon Dr. Les Pou review, he noted advanced atrophy within mesial temporal lobe structures, also raising concerns for underlying Alzheimer's disease. No more recent neuroimaging was available for review.   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   Elevated PSA 08/16/2014   History of adenomatous polyp of colon 10/11/2020   Hyperlipidemia    Hypothyroidism 10/11/2020   Malignant neoplasm of prostate 10/25/2014    Past Surgical History:  Procedure Laterality Date   PROSTATECTOMY      Current Outpatient  Medications:    Apoaequorin (PREVAGEN) 10 MG CAPS, , Disp: , Rfl:    atorvastatin (LIPITOR) 40 MG tablet, Take 40 mg by mouth daily., Disp: , Rfl:    co-enzyme Q-10 30 MG capsule, Take 30 mg by mouth 3 (three) times daily., Disp: , Rfl:    Coenzyme Q10 (CO Q-10) 300 MG CAPS, 1 capsule with a meal, Disp: , Rfl:    donepezil (ARICEPT) 10 MG tablet, Take 1 tablet daily, Disp: 90 tablet, Rfl: 3   memantine (NAMENDA) 10 MG tablet, Take 1 tablet at night, Disp: 90 tablet, Rfl: 4   tadalafil (CIALIS) 20 MG tablet, Take by mouth., Disp: , Rfl:   Clinical Interview:   The following information was obtained during a clinical interview with Mr. Neisler and his fiance prior to cognitive testing.  Cognitive Symptoms: Decreased short-term memory: Denied. When asked about memory concerns, Mr. Beier generally denied significant concerns, noting his perception that these abilities have seemed to improve over time. He later stated "I know I have this disease but I feel the same as I did when I was [younger]."His fiance expressed far greater concerns, stating that short-term memory impairment is quite significant but likely very similar to how it was in 2021 during his previous evaluation. Examples were consistent with what has been previously described in his medical records (see above).  Decreased long-term memory: Denied. Decreased attention/concentration: Denied. His fiance reported somewhat greater concerns, also stating that these abilities have  seemed stable relative to his previous evaluation.  Reduced processing speed: Denied. Difficulties with executive functions: Denied. His fiance alluded to greater concerns surrounding multi-tasking, organization, and decision making. They both denied trouble with impulsivity or any significant personality changes.  Difficulties with emotion regulation: Denied. Difficulties with receptive language: Denied. Difficulties with word finding: Denied. His fiance reported word finding difficulties occurring "a little bit but not often." Decreased visuoperceptual ability: Denied.  Trajectory of deficits: Per medical records, memory and other cognitive difficulties were said to be present at least since 2019 if not earlier. These have progressively worsened over the years.   Difficulties completing ADLs: While Mr. Meek denied all functional concerns and reported driving without issue, his fiance clarified that she has fully taken over all medication, financial, and bill paying responsibilities. She also added that Mr. Michel no longer drives due to several instances in the past where he would make wrong turns and get lost.   Additional Medical History: History of traumatic brain injury/concussion: Endorsed. He reported sustaining a concussion during a water tubing incident when he was around 56 years old. Medical records suggest he was admitted to the hospital overnight for observation. He denied any persisting difficulties stemming from this event. No more recent head injuries were reported.  History of stroke: Denied. History of seizure activity: Denied. History of known exposure to toxins: Denied. Symptoms of chronic pain: Denied. Experience of frequent headaches/migraines: Denied. Frequent instances of dizziness/vertigo: Denied.  Sensory changes: He wears reading glasses with benefit. Other sensory changes/difficulties (e.g., hearing, taste, smell) were denied.  Balance/coordination difficulties:  Denied. He also denied any recent falls.  Other motor difficulties: Denied.  Sleep History: Estimated hours obtained each night: 5-7 hours.  Difficulties falling asleep: Denied. His fiance noted that he stays up very late and might not go to sleep certain nights. At least seem of this appeared to be Mr. Noga personal choice to stay up late rather than unintentional difficulties falling asleep.  Difficulties staying asleep: Denied. Feels rested and  refreshed upon awakening: Endorsed.  History of snoring: Denied. History of waking up gasping for air: Denied. Witnessed breath cessation while asleep: Denied.  History of vivid dreaming: Denied. Excessive movement while asleep: Denied. Instances of acting out his dreams: Denied.  Psychiatric/Behavioral Health History: Depression: Acutely, he acknowledged some depressed mood surrounding the recent passing of his brother. Outside of this, he denied remote or chronic mental health concerns or prior diagnoses. Current or remote suicidal ideation, intent, or plan was denied.  Anxiety: Denied. Mania: Denied. Trauma History: Denied. Visual/auditory hallucinations: Denied. Delusional thoughts: Denied.  Tobacco: Denied. Alcohol: Medical records suggest a remote history of excessive alcohol consumption. However, this was stopped in 1992. He denied current alcohol use.  Recreational drugs: Denied.  Family History: Problem Relation Age of Onset   Diabetes Mother    This information was confirmed by Mr. Barney.  Academic/Vocational History: Highest level of educational attainment: 9 years. He left school during the 10th grade. He reported doing well in elementary school settings but had greater difficulties in middle and high school settings. He reported trouble with spelling but did not report any other known relative weaknesses across academic subjects.  History of developmental delay: Denied. History of grade repetition: Endorsed. He did  not specify; however, medical records suggest that he was previously held back one year.  Enrollment in special education courses: Denied. History of LD/ADHD: Denied.  Employment: Unemployed. 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 He was furloughed in 2020 and has not returned to work. This is likely caused by severe memory impairment making it extremely hard for him to earn and maintain competitive employment.   Evaluation Results:   Behavioral Observations: Mr. Cruickshank was accompanied by his fiance, arrived to his appointment on time, and was appropriately dressed and groomed. He appeared alert. Observed gait and station were within normal limits. Gross motor functioning appeared intact upon informal observation and no abnormal movements (e.g., tremors) were noted. His affect was generally relaxed and positive. Spontaneous speech was fluent and word finding difficulties were not observed during the clinical interview. Thought processes were coherent, organized, and normal in content. Insight into his cognitive difficulties appeared very poor in that he generally denied all cognitive dysfunction and reported no ADL deficits, despite objective testing across both evaluations suggesting notable ongoing impairment.   During testing, word finding difficulties were quite noticeable. He required task instructions to be repeated several times, both due to forgetting original instructions as well as trouble comprehending more complex tasks. Sustained attention was appropriate. Task engagement was adequate and he persisted when challenged. He was repetitive in conversation, often telling the psychometrist how strong his memory used to be. Overall, Mr. Van was cooperative with the clinical interview and subsequent testing procedures.   Adequacy of Effort: The validity of neuropsychological  testing is limited by the extent to which the individual being tested may be assumed to have exerted adequate effort during testing. Mr. Belmar expressed his intention to perform to the best of his abilities and exhibited adequate task engagement and persistence. Scores across stand-alone and embedded performance validity measures were below expectation. However, these below expectation performances are believed to be due to true cognitive impairment rather than poor engagement or attempts to perform poorly. As such, the results of the current evaluation are believed to be a valid representation of Mr. Skolnick current cognitive functioning.  Test Results: Mr. Ander was  very disoriented at the time of the current evaluation. He was unable to provide a response when asked his current age. He also incorrectly stated the current year ("2022") and was unable to state the current month, date, or day of the week. When asked the current time, he responded "5:30 or 6:00" despite his appointment starting at 1:00.   Intellectual abilities based upon educational and vocational attainment were estimated to be in the below average range. Premorbid abilities were estimated to be within the well below average range based upon a single-word reading test.   Processing speed was exceptionally low. Basic attention was exceptionally low to well below average. More complex attention (e.g., working memory) was exceptionally low to below average. Executive functioning was exceptionally low outside of a task assessing abstract reasoning. He also performed in the exceptionally low range across a task assessing safety and judgment. Points were primarily lost due to the provision of overly vague responses rather than unsafe responses.   Assessed receptive language abilities were exceptionally low. Points were lost across all aspects of this task, especially when sequencing commands, following multi-step commands, and  understanding more complex sentence structure. Likewise, Mr. Bethel also exhibited trouble understanding task instructions, likely due to both memory impairment and comprehension deficits. Assessed expressive language was variable. Phonemic fluency was below average to average, semantic fluency was exceptionally low to well below average, and confrontation naming was exceptionally low    Assessed visuospatial/visuoconstructional abilities were exceptionally low. Across his drawing of a clock, abnormalities in numerical placement were observed, with the number 12 being placed where 10 would normally be seen. He was also unable to make an attempt to place the clock hands. His copy of a complex figure was noteworthy for severe visual distortions and a very disjointed approach. He was unable to complete the outer rectangle and shapes were placed in various locations seemingly independent of the larger image.     Learning (i.e., encoding) of novel verbal information was exceptionally low. Spontaneous delayed recall (i.e., retrieval) of previously learned information was also exceptionally low. Retention rates were 0% across a story learning task, 0% across a list learning task, and 33% (raw score of 1) across a figure drawing task. Performance across recognition tasks was exceptionally low to well below average, suggesting negligible evidence for information consolidation.   Results of emotional screening instruments suggested that recent symptoms of generalized anxiety were in the minimal range, while symptoms of depression were within normal limits. A screening instrument assessing recent sleep quality suggested the presence of minimal sleep dysfunction.  Tables of Scores:   Note: This summary of test scores accompanies the interpretive report and should not be considered in isolation without reference to the appropriate sections in the text. Descriptors are based on appropriate normative data and may be  adjusted based on clinical judgment. Terms such as "Within Normal Limits" and "Outside Normal Limits" are used when a more specific description of the test score cannot be determined. Descriptors refer to the current evaluation only.         Percentile - Normative Descriptor > 98 - Exceptionally High 91-97 - Well Above Average 75-90 - Above Average 25-74 - Average 9-24 - Below Average 2-8 - Well Below Average < 2 - Exceptionally Low         May 2021 Current    Orientation:       Raw Score Raw Score Percentile   NAB Orientation, Form 1 --- 18/29 --- ---  Cognitive Screening:       Raw Score Raw Score Percentile   SLUMS: --- 6/30 --- ---        RBANS, Form A: Standard Score/ Scaled Score Standard Score/ Scaled Score Percentile   Total Score 61 42 <1 Exceptionally Low  Immediate Memory 57 40 <1 Exceptionally Low    List Learning 4 1 <1 Exceptionally Low    Story Memory 2 1 <1 Exceptionally Low  Visuospatial/Constructional 69 50 <1 Exceptionally Low    Figure Copy 5 1 <1 Exceptionally Low    Line Orientation 3-9%ile 1/20 <2 Exceptionally Low  Language 90 40 <1 Exceptionally Low    Picture Naming 51-75%ile 4/10 <2 Exceptionally Low    Semantic Fluency 7 1 <1 Exceptionally Low  Attention 88 46 <1 Exceptionally Low    Digit Span '12 3 1 '$ Exceptionally Low    Coding 4 1 <1 Exceptionally Low  Delayed Memory 40 40 <1 Exceptionally Low    List Recall 0/12 0/10 <2 Exceptionally Low    List Recognition 12/20 12/20 <2 Exceptionally Low    Story Recall 1 1 <1 Exceptionally Low    Story Recognition --- 7/12 5-7 Well Below Average    Figure Recall 1 1 <1 Exceptionally Low    Figure Recognition --- 1/8 1 Exceptionally Low         Intellectual Functioning:       Standard Score Standard Score Percentile   Test of Premorbid Functioning: --- 70 2 Well Below Average        Attention/Executive Function:      Trail Making Test (TMT): T Score Raw Score  (T Score) Percentile     Part A 37  257 secs.,  1 error (24) <1 Exceptionally Low    Part B 43 Discontinued --- Impaired         Scaled Score Scaled Score Percentile   WAIS-IV Coding: 4 Discontinued (comprehension) --- Impaired         Standard Score Standard Score Percentile   WAIS-IV Working Memory Index: 92 69 2 Well Below Average          Scaled Score Scaled Score Percentile   WAIS-IV Digit Span: '10 4 2 '$ Well Below Average    Forward '12 5 5 '$ Well Below Average    Backward '10 7 16 '$ Below Average    Sequencing '7 3 1 '$ Exceptionally Low  WAIS-IV Arithmetic: '7 5 5 '$ Well Below Average         Scaled Score Scaled Score Percentile   WAIS-IV Similarities: --- 8 25 Average        D-KEFS Color-Word Interference Test: Raw Score (Scaled Score) Raw Score (Scaled Score) Percentile     Color Naming --- 121 secs. (1) <1 Exceptionally Low    Word Reading --- 56 secs. (1) <1 Exceptionally Low    Inhibition --- 128 secs. (1) <1 Exceptionally Low      Total Errors --- 9 errors (1) <1 Exceptionally Low    Inhibition/Switching --- 126 secs. (2) <1 Exceptionally Low      Total Errors --- 25 errors (1) <1 Exceptionally Low        D-KEFS Verbal Fluency Test: Raw Score (Scaled Score) Raw Score (Scaled Score) Percentile     Letter Total Correct --- 27 (7) 16 Below Average    Category Total Correct --- 26 (5) 5 Well Below Average    Category Switching Total Correct --- 3 (1) <1 Exceptionally Low    Category Switching Accuracy ---  2 (1) <1 Exceptionally Low      Total Set Loss Errors --- 0 (13) 84 Above Average      Total Repetition Errors --- 8 (5) 5 Well Below Average        NAB Executive Functions Module, Form 1: T Score T Score Percentile     Judgment --- 27 1 Exceptionally Low        Language:      Verbal Fluency Test: T Score Raw Score (T Score) Percentile     Phonemic Fluency (FAS) 58 27 (46) 34 Average    Animal Fluency 50 8 (30) 2 Well Below Average         NAB Language Module, Form 1: T Score T Score Percentile      Auditory Comprehension --- 19 <1 Exceptionally Low    Naming 55 16/31 (19) <1 Exceptionally Low        Visuospatial/Visuoconstruction:       Raw Score Raw Score Percentile   Clock Drawing: 7/10 4/10 --- Impaired         Scaled Score Scaled Score Percentile   WAIS-IV Block Design: --- 2 <1 Exceptionally Low        Mood and Personality:       Raw Score Raw Score Percentile   PROMIS Depression Questionnaire: --- 10 --- None to Slight  PROMIS Anxiety Questionnaire: --- 9 --- None to Slight        Additional Questionnaires:       Raw Score Raw Score Percentile   PROMIS Sleep Disturbance Questionnaire: --- 9 --- None to Slight   Informed Consent and Coding/Compliance:   The current evaluation represents a clinical evaluation for the purposes previously outlined by the referral source and is in no way reflective of a forensic evaluation.   Mr. Pieroni was provided with a verbal description of the nature and purpose of the present neuropsychological evaluation. Also reviewed were the foreseeable risks and/or discomforts and benefits of the procedure, limits of confidentiality, and mandatory reporting requirements of this provider. The patient was given the opportunity to ask questions and receive answers about the evaluation. Oral consent to participate was provided by the patient.   This evaluation was conducted by Christia Reading, Ph.D., ABPP-CN, board certified clinical neuropsychologist. Mr. Bentsen completed a clinical interview with Dr. Melvyn Novas, billed as one unit [phone], and 155 minutes of cognitive testing and scoring, billed as one unit [phone] and four additional units [id number]. Psychometrist Milana Kidney, B.S., assisted Dr. Melvyn Novas with test administration and scoring procedures. As a separate and discrete service, Dr. Melvyn Novas spent a total of 160 minutes in interpretation and report writing billed as one unit [phone] and two units 96133.

## 2022-06-24 NOTE — Progress Notes (Signed)
   Psychometrician Note   Cognitive testing was administered to Devon Romero by Milana Kidney, B.S. (psychometrist) under the supervision of Dr. Christia Reading, Ph.D., licensed psychologist on 06/24/2022. Devon Romero did not appear overtly distressed by the testing session per behavioral observation or responses across self-report questionnaires. Rest breaks were offered.    The battery of tests administered was selected by Dr. Christia Reading, Ph.D. with consideration to Devon Romero current level of functioning, the nature of his symptoms, emotional and behavioral responses during interview, level of literacy, observed level of motivation/effort, and the nature of the referral question. This battery was communicated to the psychometrist. Communication between Dr. Christia Reading, Ph.D. and the psychometrist was ongoing throughout the evaluation and Dr. Christia Reading, Ph.D. was immediately accessible at all times. Dr. Christia Reading, Ph.D. provided supervision to the psychometrist on the date of this service to the extent necessary to assure the quality of all services provided.    Devon Romero will return within approximately 1-2 weeks for an interactive feedback session with Dr. Melvyn Novas at which time his test performances, clinical impressions, and treatment recommendations will be reviewed in detail. Devon Romero understands he can contact our office should he require our assistance before this time.  A total of 155 minutes of billable time were spent face-to-face with Devon Romero by the psychometrist. This includes both test administration and scoring time. Billing for these services is reflected in the clinical report generated by Dr. Christia Reading, Ph.D.  This note reflects time spent with the psychometrician and does not include test scores or any clinical interpretations made by Dr. Melvyn Novas. The full report will follow in a separate note.

## 2022-07-11 ENCOUNTER — Ambulatory Visit (INDEPENDENT_AMBULATORY_CARE_PROVIDER_SITE_OTHER): Payer: Medicare Other | Admitting: Psychology

## 2022-07-11 DIAGNOSIS — G300 Alzheimer's disease with early onset: Secondary | ICD-10-CM | POA: Diagnosis not present

## 2022-07-11 DIAGNOSIS — F0280 Dementia in other diseases classified elsewhere without behavioral disturbance: Secondary | ICD-10-CM

## 2022-07-11 NOTE — Progress Notes (Signed)
   Neuropsychology Feedback Session Devon Romero. [place] [facility]  Reason for Referral:   Devon Romero is a 57 y.o. right-handed African-American male referred by Sharene Butters, PA-C, to characterize his current cognitive functioning and assist with diagnostic clarity and treatment planning in the context of prior concerns surrounding an early-onset Alzheimer's disease presentation and concern for progressive decline.   Feedback:   Devon Romero completed a comprehensive neuropsychological evaluation on 06/24/2022. Please refer to that encounter for the full report and recommendations. Briefly, results suggested diffuse cognitive dysfunction. He performed well relative to age-matched peers across phonemic fluency and an isolated task assessing abstract reasoning. However, all other assessed domains exhibited severe impairment. Relative to his previous evaluation in May 2021, significant performance declines were observed across processing speed, attention/concentration, cognitive flexibility, semantic fluency, and confrontation naming. A more modest decline was noted across visuospatial abilities. Memory performances were stable across the two evaluations in that they both exhibited severe impairments with amnestic patterns and poor recognition performances. Regarding etiology, I agree with prior concerns raised by Dr. Nicole Kindred surrounding early-onset Alzheimer's disease. Neuroimaging suggesting advanced temporal lobe atrophy, as well as his lumbar puncture suggesting pathological changes consistent with this illness, are certainly very worrisome. Across memory tasks, he did not benefit from repeated exposure to information, was essentially amnestic after brief delays, and performed poorly across yes/no recognition trials. Taken together, this suggests rapid forgetting and a prominent memory storage impairment, both of which are hallmark characteristics of this illness.  Progressive decline is another very worrisome feature of his current clinical presentation. While certainly a very rare presentation given his overall age, this continues to appear the best explanation for ongoing cognitive and functional decline. Continued medical monitoring will be important moving forward.   Devon Romero was accompanied by his fiance during the current feedback session. Content of the current session focused on the results of his neuropsychological evaluation. Devon Romero was given the opportunity to ask questions and his questions were answered. He was encouraged to reach out should additional questions arise. A copy of his report was provided at the conclusion of the visit.      30 minutes were spent conducting the current feedback session with Devon Romero, billed as one unit [phone].

## 2022-07-24 ENCOUNTER — Encounter: Payer: 59 | Admitting: Psychology

## 2022-07-31 ENCOUNTER — Encounter: Payer: 59 | Admitting: Psychology

## 2022-10-04 ENCOUNTER — Other Ambulatory Visit: Payer: Self-pay | Admitting: Physician Assistant

## 2022-11-12 ENCOUNTER — Encounter: Payer: 59 | Admitting: Psychology

## 2022-11-20 ENCOUNTER — Encounter: Payer: 59 | Admitting: Psychology

## 2022-11-26 ENCOUNTER — Encounter: Payer: Self-pay | Admitting: Physician Assistant

## 2022-11-26 ENCOUNTER — Ambulatory Visit: Payer: 59 | Admitting: Physician Assistant

## 2022-11-26 DIAGNOSIS — Z029 Encounter for administrative examinations, unspecified: Secondary | ICD-10-CM

## 2023-01-06 ENCOUNTER — Ambulatory Visit: Payer: Medicare Other | Admitting: Physician Assistant

## 2023-01-23 ENCOUNTER — Ambulatory Visit: Payer: Medicare Other | Admitting: Physician Assistant

## 2023-02-11 ENCOUNTER — Encounter: Payer: Self-pay | Admitting: Physician Assistant

## 2023-02-11 ENCOUNTER — Ambulatory Visit (INDEPENDENT_AMBULATORY_CARE_PROVIDER_SITE_OTHER): Payer: Medicare Other | Admitting: Physician Assistant

## 2023-02-11 VITALS — HR 60 | Resp 18 | Wt 173.0 lb

## 2023-02-11 DIAGNOSIS — G300 Alzheimer's disease with early onset: Secondary | ICD-10-CM

## 2023-02-11 DIAGNOSIS — F0280 Dementia in other diseases classified elsewhere without behavioral disturbance: Secondary | ICD-10-CM

## 2023-02-11 MED ORDER — DONEPEZIL HCL 23 MG PO TABS: ORAL_TABLET | ORAL | 11 refills | Status: DC

## 2023-02-11 MED ORDER — MEMANTINE HCL 10 MG PO TABS: ORAL_TABLET | ORAL | 3 refills | Status: DC

## 2023-02-11 MED ORDER — DONEPEZIL HCL 23 MG PO TABS: 23.0000 mg | ORAL_TABLET | Freq: Every day | ORAL | 11 refills | Status: DC

## 2023-02-11 NOTE — Progress Notes (Signed)
Assessment/Plan:   Dementia due to Alzheimer's disease, early onset  Devon Romero is a very pleasant 57 y.o. RH male with a history of hyperlipidemia, hypertension, hypothyroidism, history of prostate cancer s/p prostatectomy and a history of dementia due to Alzheimer's disease, early onset per neuropsych evaluation and positive lumbar puncture.  The patient has been seen at [facility] neurology since his last visit in December 2023 not deeming him a candidate for agents like lecanemab or Donanemab do to his current stage of the disease.  Prior MRI of the brain personally reviewed was remarkable for mild atrophy, which was advanced for age.  His memory is worse from prior, and there are some behavioral changes, such as a tendency to wander off, requiring more vigilance.  Unfortunately, his fiance works during the day, and he does not have any home health nursing.  We discussed the possibility of attending adult day program for socialization, as well as for safety.  She reported that we will look into it.  Other options, would include memory care if the condition worsens.  The patient has been taking memantine 20 mg at night, because his fiance cannot be there in the morning to provide the medications.  We tried to change it to [place] for convenience, but the cost is extremely high.  She may have some changes in her insurance, will revise, and if the cost is reasonable, we will proceed with the prescription.   Follow up in 6 months. Continue memantine 20 mg at night for now Increase donepezil to 23 mg nightly. Recommend good control of her cardiovascular risk factors Continue to control mood as per PCP     Subjective:    This patient is accompanied in the office by his fiance who supplements the history.  Previous records as well as any outside records available were reviewed prior to todays visit. Patient was last seen on 04/23/2022.   Any changes in memory since last visit? " It is  worse". Patient has more difficulty remembering recent conversations and people names .  His short-term memory is worse.  His long-term memory is also affected now. repeats oneself?  Endorsed, more frequently.  During this visit he has several times questions about his ha1 Disoriented when walking into a room?  At home yes, but when out he is disoriented his fiance says Leaving objects in unusual places?  May misplace things more frequently. Wandering behavior? Endorsed, especially going to his backyard and then becoming disoriented next Any personality changes since last visit?  denies   Any worsening depression?:  Denies.   Hallucinations or paranoia?  Not frequently, but one time he reported that someone run through the house   Seizures? denies    Any sleep changes?  Denies vivid dreams, REM behavior or sleepwalking . He is more confused in the morning when he wakes up, not knowing where he is, asking "where am I, do I have to go to work " Sleep apnea?   Denies.   Any hygiene concerns? Endorsed, he has to be reminded to.  Sometimes he may not remember where is the hot weather is cold in the shower Independent of bathing and dressing?  Endorsed needs help.  Today he put one T-shirt on top of the other Does the patient needs help with medications?  Fiance is in charge, but she can only administer it at night, because in the morning she works Who is in charge of the finances?  Fiance is in charge Any  changes in appetite?  He does not eat much, "he eats smaller portions, more frequently " Patient have trouble swallowing? Denies.   Does the patient cook? No Any headaches?   denies   Chronic back pain  denies   Ambulates with difficulty? Denies.  He continues to exercise at home and use the stationary bike Recent falls or head injuries? denies     Unilateral weakness, numbness or tingling? denies   Any tremors?  Denies   Any anosmia?  Denies   Any incontinence of urine?  Endorsed, wears  diapers Any bowel dysfunction?   Denies      Patient lives with fiancee Does the patient drive? No longer drives     PREVIOUS MEDICATIONS:  Mood      04/23/2022   11:00 AM 10/22/2021    7:00 PM 12/02/2019    8:00 AM  MMSE - Mini Mental State Exam  Orientation to time 0 0 5  Orientation to Place 5 5 5   Registration 3 3 3   Attention/ Calculation 1 1 4   Recall 0 0 0  Language- name 2 objects 2 2 2   Language- repeat 1 1 0  Language- follow 3 step command 3 3 3   Language- read & follow direction 1 1 1   Write a sentence 1 1 0  Copy design 0 0 1  Total score 17 17 24        No data to display          Objective:     PHYSICAL EXAMINATION:    VITALS:   Vitals:   02/11/23 1507  Pulse: 60  Resp: 18  SpO2: 100%  Weight: 173 lb (78.5 kg)    GEN:  The patient appears stated age and is in NAD. HEENT:  Normocephalic, atraumatic.   Neurological examination:  General: NAD, well-groomed, appears stated age. Orientation: The patient is alert. Oriented to person, not to place or date Cranial nerves: There is good facial symmetry.The speech is fluent and clear. No aphasia or dysarthria. Fund of knowledge is reduced recent and remote memory are impaired. Attention and concentration are reduced.  Able to name objects and repeat phrases.  Hearing is intact to conversational tone.   Sensation: Sensation is intact to light touch throughout Motor: Strength is at least antigravity x4. DTR's 2/4 in UE/LE     Movement examination: Tone: There is normal tone in the UE/LE Abnormal movements:  no tremor.  No myoclonus.  No asterixis.   Coordination:  There is no decremation with RAM's. Normal finger to nose  Gait and Station: The patient has no difficulty arising out of a deep-seated chair without the use of the hands. The patient's stride length is good.  Gait is cautious and narrow.    Thank you for allowing [place] the opportunity to participate in the care of this nice patient. Please  do not hesitate to contact us for any questions or concerns.   Total time spent on today's visit was 30 minutes dedicated to this patient today, preparing to see patient, examining the patient, ordering tests and/or medications and counseling the patient, documenting clinical information in the EHR or other health record, independently interpreting results and communicating results to the patient/family, discussing treatment and goals, answering patient's questions and coordinating care.  Cc:  Blair Heys, MD  Marlowe Kays 02/11/2023 5:27 PM

## 2023-02-11 NOTE — Patient Instructions (Signed)
It was a pleasure to see you today at our office.   Recommendations:  Meds: Follow up in  6 months Increase donepezil  to 23 mg daily.   Continue  Memantine 10 mg tablets nightly   Consider Adult Day Program     RECOMMENDATIONS FOR ALL PATIENTS WITH MEMORY PROBLEMS: 1. Continue to exercise (Recommend 30 minutes of walking everyday, or 3 hours every week) 2. Increase social interactions - continue going to [place] and enjoy social gatherings with friends and family 3. Eat healthy, avoid fried foods and eat more fruits and vegetables 4. Maintain adequate blood pressure, blood sugar, and blood cholesterol level. Reducing the risk of stroke and cardiovascular disease also helps promoting better memory. 5. Avoid stressful situations. Live a simple life and avoid aggravations. Organize your time and prepare for the next day in anticipation. 6. Sleep well, avoid any interruptions of sleep and avoid any distractions in the bedroom that may interfere with adequate sleep quality 7. Avoid sugar, avoid sweets as there is a strong link between excessive sugar intake, diabetes, and cognitive impairment We discussed the Mediterranean diet, which has been shown to help patients reduce the risk of progressive memory disorders and reduces cardiovascular risk. This includes eating fish, eat fruits and green leafy vegetables, nuts like almonds and hazelnuts, walnuts, and also use olive oil. Avoid fast foods and fried foods as much as possible. Avoid sweets and sugar as sugar use has been linked to worsening of memory function.  There is always a concern of gradual progression of memory problems. If this is the case, then we may need to adjust level of care according to patient needs. Support, both to the patient and caregiver, should then be put into place.    The Alzheimer's Association is here all day, every day for people facing Alzheimer's disease through our free 24/7 Helpline: [phone]. The Helpline  provides reliable information and support to all those who need assistance, such as individuals living with memory loss, Alzheimer's or other dementia, caregivers, health care professionals and the public.  Our highly trained and knowledgeable staff can help you with: Understanding memory loss, dementia and Alzheimer's  Medications and other treatment options  General information about aging and brain health  Skills to provide quality care and to find the best care from professionals  Legal, financial and living-arrangement decisions Our Helpline also features: Confidential care consultation provided by master's level clinicians who can help with decision-making support, crisis assistance and education on issues families face every day  Help in a caller's preferred language using our translation service that features more than 200 languages and dialects  Referrals to local community programs, services and ongoing support     FALL PRECAUTIONS: Be cautious when walking. Scan the area for obstacles that may increase the risk of trips and falls. When getting up in the mornings, sit up at the edge of the bed for a few minutes before getting out of bed. Consider elevating the bed at the head end to avoid drop of blood pressure when getting up. Walk always in a well-lit room (use night lights in the walls). Avoid area rugs or power cords from appliances in the middle of the walkways. Use a walker or a cane if necessary and consider physical therapy for balance exercise. Get your eyesight checked regularly.  FINANCIAL OVERSIGHT: Supervision, especially oversight when making financial decisions or transactions is also recommended.  HOME SAFETY: Consider the safety of the kitchen when operating appliances like stoves,  microwave oven, and blender. Consider having supervision and share cooking responsibilities until no longer able to participate in those. Accidents with firearms and other hazards in the house  should be identified and addressed as well.   ABILITY TO BE LEFT ALONE: If patient is unable to contact 911 operator, consider using LifeLine, or when the need is there, arrange for someone to stay with patients. Smoking is a fire hazard, consider supervision or cessation. Risk of wandering should be assessed by caregiver and if detected at any point, supervision and safe proof recommendations should be instituted.  MEDICATION SUPERVISION: Inability to self-administer medication needs to be constantly addressed. Implement a mechanism to ensure safe administration of the medications.   DRIVING: Regarding driving, in patients with progressive memory problems, driving will be impaired. We advise to have someone else do the driving if trouble finding directions or if minor accidents are reported. Independent driving assessment is available to determine safety of driving.   If you are interested in the driving assessment, you can contact the following:  [employer] in [place] [phone]  [facility] [phone]  [facility] [phone] or [phone]      Mediterranean Diet A Mediterranean diet refers to food and lifestyle choices that are based on the traditions of countries located on the [employer]. This way of eating has been shown to help prevent certain conditions and improve outcomes for people who have chronic diseases, like kidney disease and heart disease. What are tips for following this plan? Lifestyle  Cook and eat meals together with your family, when possible. Drink enough fluid to keep your urine clear or pale yellow. Be physically active every day. This includes: Aerobic exercise like running or swimming. Leisure activities like gardening, walking, or housework. Get 7-8 hours of sleep each night. If recommended by your health care provider, drink red wine in moderation. This means 1  glass a day for nonpregnant women and 2 glasses a day for men. A glass of wine equals 5 oz (150 mL). Reading food labels  Check the serving size of packaged foods. For foods such as rice and pasta, the serving size refers to the amount of cooked product, not dry. Check the total fat in packaged foods. Avoid foods that have saturated fat or trans fats. Check the ingredients list for added sugars, such as corn syrup. Shopping  At the grocery store, buy most of your food from the areas near the walls of the store. This includes: Fresh fruits and vegetables (produce). Grains, beans, nuts, and seeds. Some of these may be available in unpackaged forms or large amounts (in bulk). Fresh seafood. Poultry and eggs. Low-fat dairy products. Buy whole ingredients instead of prepackaged foods. Buy fresh fruits and vegetables in-season from local farmers markets. Buy frozen fruits and vegetables in resealable bags. If you do not have access to quality fresh seafood, buy precooked frozen shrimp or canned fish, such as tuna, salmon, or sardines. Buy small amounts of raw or cooked vegetables, salads, or olives from the deli or salad bar at your store. Stock your pantry so you always have certain foods on hand, such as olive oil, canned tuna, canned tomatoes, rice, pasta, and beans. Cooking  Cook foods with extra-virgin olive oil instead of using butter or other vegetable oils. Have meat as a side dish, and have vegetables or grains as your main dish. This means having meat in small portions or adding small amounts of meat  to foods like pasta or stew. Use beans or vegetables instead of meat in common dishes like chili or lasagna. Experiment with different cooking methods. Try roasting or broiling vegetables instead of steaming or sauteing them. Add frozen vegetables to soups, stews, pasta, or rice. Add nuts or seeds for added healthy fat at each meal. You can add these to yogurt, salads, or vegetable  dishes. Marinate fish or vegetables using olive oil, lemon juice, garlic, and fresh herbs. Meal planning  Plan to eat 1 vegetarian meal one day each week. Try to work up to 2 vegetarian meals, if possible. Eat seafood 2 or more times a week. Have healthy snacks readily available, such as: Vegetable sticks with hummus. Greek yogurt. Fruit and nut trail mix. Eat balanced meals throughout the week. This includes: Fruit: 2-3 servings a day Vegetables: 4-5 servings a day Low-fat dairy: 2 servings a day Fish, poultry, or lean meat: 1 serving a day Beans and legumes: 2 or more servings a week Nuts and seeds: 1-2 servings a day Whole grains: 6-8 servings a day Extra-virgin olive oil: 3-4 servings a day Limit red meat and sweets to only a few servings a month What are my food choices? Mediterranean diet Recommended Grains: Whole-grain pasta. Brown rice. Bulgar wheat. Polenta. Couscous. Whole-wheat bread. Orpah Cobb. Vegetables: Artichokes. Beets. Broccoli. Cabbage. Carrots. Eggplant. Green beans. Chard. Kale. Spinach. Onions. Leeks. Peas. Squash. Tomatoes. Peppers. Radishes. Fruits: Apples. Apricots. Avocado. Berries. Bananas. Cherries. Dates. Figs. Grapes. Lemons. Melon. Oranges. Peaches. Plums. Pomegranate. Meats and other protein foods: Beans. Almonds. Sunflower seeds. Pine nuts. Peanuts. Cod. Salmon. Scallops. Shrimp. Tuna. Tilapia. Clams. Oysters. Eggs. Dairy: Low-fat milk. Cheese. Greek yogurt. Beverages: Water. Red wine. Herbal tea. Fats and oils: Extra virgin olive oil. Avocado oil. Grape seed oil. Sweets and desserts: [place] yogurt with honey. Baked apples. Poached pears. Trail mix. Seasoning and other foods: Basil. Cilantro. Coriander. Cumin. Mint. Parsley. Sage. Rosemary. Tarragon. Garlic. Oregano. Thyme. Pepper. Balsalmic vinegar. Tahini. Hummus. Tomato sauce. Olives. Mushrooms. Limit these Grains: Prepackaged pasta or rice dishes. Prepackaged cereal with added  sugar. Vegetables: Deep fried potatoes (french fries). Fruits: Fruit canned in syrup. Meats and other protein foods: Beef. Pork. Lamb. Poultry with skin. Hot dogs. Tomasa Blase. Dairy: Ice cream. Sour cream. Whole milk. Beverages: Juice. Sugar-sweetened soft drinks. Beer. Liquor and spirits. Fats and oils: Butter. Canola oil. Vegetable oil. Beef fat (tallow). Lard. Sweets and desserts: Cookies. Cakes. Pies. Candy. Seasoning and other foods: Mayonnaise. Premade sauces and marinades. The items listed may not be a complete list. Talk with your dietitian about what dietary choices are right for you. Summary The Mediterranean diet includes both food and lifestyle choices. Eat a variety of fresh fruits and vegetables, beans, nuts, seeds, and whole grains. Limit the amount of red meat and sweets that you eat. Talk with your health care provider about whether it is safe for you to drink red wine in moderation. This means 1 glass a day for nonpregnant women and 2 glasses a day for men. A glass of wine equals 5 oz (150 mL). This information is not intended to replace advice given to you by your health care provider. Make sure you discuss any questions you have with your health care provider. Document Released: 02/14/2016 Document Revised: 03/18/2016 Document Reviewed: 02/14/2016 Elsevier Interactive Patient Education  2017 [employer].

## 2023-05-04 ENCOUNTER — Other Ambulatory Visit: Payer: Self-pay | Admitting: Internal Medicine

## 2023-05-04 DIAGNOSIS — N289 Disorder of kidney and ureter, unspecified: Secondary | ICD-10-CM

## 2023-05-15 ENCOUNTER — Ambulatory Visit
Admission: RE | Admit: 2023-05-15 | Discharge: 2023-05-15 | Disposition: A | Discharge status: Home / Self Care | Payer: Medicare Other | Source: Ambulatory Visit | Attending: Internal Medicine | Admitting: Internal Medicine

## 2023-05-15 DIAGNOSIS — N289 Disorder of kidney and ureter, unspecified: Secondary | ICD-10-CM

## 2023-08-14 ENCOUNTER — Ambulatory Visit: Payer: Medicare Other | Admitting: Physician Assistant

## 2023-08-20 ENCOUNTER — Encounter: Payer: Self-pay | Admitting: Physician Assistant

## 2023-08-20 ENCOUNTER — Ambulatory Visit (INDEPENDENT_AMBULATORY_CARE_PROVIDER_SITE_OTHER): Payer: Medicare Other | Admitting: Physician Assistant

## 2023-08-20 VITALS — BP 122/81 | HR 63 | Resp 20 | Wt 161.0 lb

## 2023-08-20 DIAGNOSIS — G300 Alzheimer's disease with early onset: Secondary | ICD-10-CM

## 2023-08-20 DIAGNOSIS — F0280 Dementia in other diseases classified elsewhere without behavioral disturbance: Secondary | ICD-10-CM

## 2023-08-20 MED ORDER — MEMANTINE HCL 10 MG PO TABS: ORAL_TABLET | ORAL | 3 refills | Status: DC

## 2023-08-20 MED ORDER — DONEPEZIL HCL 23 MG PO TABS: 23.0000 mg | ORAL_TABLET | Freq: Every day | ORAL | 3 refills | Status: DC

## 2023-08-20 NOTE — Patient Instructions (Addendum)
It was a pleasure to see you today at our office.   Recommendations:  Meds: Follow up in  6 months  donepezil  to 23 mg daily.   Continue  Memantine 10  mg tablets twice a day   Consider Adult Day Program     RECOMMENDATIONS FOR ALL PATIENTS WITH MEMORY PROBLEMS: 1. Continue to exercise (Recommend 30 minutes of walking everyday, or 3 hours every week) 2. Increase social interactions - continue going to [place] and enjoy social gatherings with friends and family 3. Eat healthy, avoid fried foods and eat more fruits and vegetables 4. Maintain adequate blood pressure, blood sugar, and blood cholesterol level. Reducing the risk of stroke and cardiovascular disease also helps promoting better memory. 5. Avoid stressful situations. Live a simple life and avoid aggravations. Organize your time and prepare for the next day in anticipation. 6. Sleep well, avoid any interruptions of sleep and avoid any distractions in the bedroom that may interfere with adequate sleep quality 7. Avoid sugar, avoid sweets as there is a strong link between excessive sugar intake, diabetes, and cognitive impairment We discussed the Mediterranean diet, which has been shown to help patients reduce the risk of progressive memory disorders and reduces cardiovascular risk. This includes eating fish, eat fruits and green leafy vegetables, nuts like almonds and hazelnuts, walnuts, and also use olive oil. Avoid fast foods and fried foods as much as possible. Avoid sweets and sugar as sugar use has been linked to worsening of memory function.  There is always a concern of gradual progression of memory problems. If this is the case, then we may need to adjust level of care according to patient needs. Support, both to the patient and caregiver, should then be put into place.    The Alzheimer's Association is here all day, every day for people facing Alzheimer's disease through our free 24/7 Helpline: [phone]. The Helpline  provides reliable information and support to all those who need assistance, such as individuals living with memory loss, Alzheimer's or other dementia, caregivers, health care professionals and the public.  Our highly trained and knowledgeable staff can help you with: Understanding memory loss, dementia and Alzheimer's  Medications and other treatment options  General information about aging and brain health  Skills to provide quality care and to find the best care from professionals  Legal, financial and living-arrangement decisions Our Helpline also features: Confidential care consultation provided by master's level clinicians who can help with decision-making support, crisis assistance and education on issues families face every day  Help in a caller's preferred language using our translation service that features more than 200 languages and dialects  Referrals to local community programs, services and ongoing support     FALL PRECAUTIONS: Be cautious when walking. Scan the area for obstacles that may increase the risk of trips and falls. When getting up in the mornings, sit up at the edge of the bed for a few minutes before getting out of bed. Consider elevating the bed at the head end to avoid drop of blood pressure when getting up. Walk always in a well-lit room (use night lights in the walls). Avoid area rugs or power cords from appliances in the middle of the walkways. Use a walker or a cane if necessary and consider physical therapy for balance exercise. Get your eyesight checked regularly.  FINANCIAL OVERSIGHT: Supervision, especially oversight when making financial decisions or transactions is also recommended.  HOME SAFETY: Consider the safety of the kitchen when operating  appliances like stoves, microwave oven, and blender. Consider having supervision and share cooking responsibilities until no longer able to participate in those. Accidents with firearms and other hazards in the house  should be identified and addressed as well.   ABILITY TO BE LEFT ALONE: If patient is unable to contact 911 operator, consider using LifeLine, or when the need is there, arrange for someone to stay with patients. Smoking is a fire hazard, consider supervision or cessation. Risk of wandering should be assessed by caregiver and if detected at any point, supervision and safe proof recommendations should be instituted.  MEDICATION SUPERVISION: Inability to self-administer medication needs to be constantly addressed. Implement a mechanism to ensure safe administration of the medications.         Mediterranean Diet A Mediterranean diet refers to food and lifestyle choices that are based on the traditions of countries located on the [employer] [place]. This way of eating has been shown to help prevent certain conditions and improve outcomes for people who have chronic diseases, like kidney disease and heart disease. What are tips for following this plan? Lifestyle  Cook and eat meals together with your family, when possible. Drink enough fluid to keep your urine clear or pale yellow. Be physically active every day. This includes: Aerobic exercise like running or swimming. Leisure activities like gardening, walking, or housework. Get 7-8 hours of sleep each night. If recommended by your health care provider, drink red wine in moderation. This means 1 glass a day for nonpregnant women and 2 glasses a day for men. A glass of wine equals 5 oz (150 mL). Reading food labels  Check the serving size of packaged foods. For foods such as rice and pasta, the serving size refers to the amount of cooked product, not dry. Check the total fat in packaged foods. Avoid foods that have saturated fat or trans fats. Check the ingredients list for added sugars, such as corn syrup. Shopping  At the grocery store, buy most of your food from the areas near the walls of the store. This includes: Fresh fruits and  vegetables (produce). Grains, beans, nuts, and seeds. Some of these may be available in unpackaged forms or large amounts (in bulk). Fresh seafood. Poultry and eggs. Low-fat dairy products. Buy whole ingredients instead of prepackaged foods. Buy fresh fruits and vegetables in-season from local farmers markets. Buy frozen fruits and vegetables in resealable bags. If you do not have access to quality fresh seafood, buy precooked frozen shrimp or canned fish, such as tuna, salmon, or sardines. Buy small amounts of raw or cooked vegetables, salads, or olives from the deli or salad bar at your store. Stock your pantry so you always have certain foods on hand, such as olive oil, canned tuna, canned tomatoes, rice, pasta, and beans. Cooking  Cook foods with extra-virgin olive oil instead of using butter or other vegetable oils. Have meat as a side dish, and have vegetables or grains as your main dish. This means having meat in small portions or adding small amounts of meat to foods like pasta or stew. Use beans or vegetables instead of meat in common dishes like chili or lasagna. Experiment with different cooking methods. Try roasting or broiling vegetables instead of steaming or sauteing them. Add frozen vegetables to soups, stews, pasta, or rice. Add nuts or seeds for added healthy fat at each meal. You can add these to yogurt, salads, or vegetable dishes. Marinate fish or vegetables using olive oil, lemon juice, garlic, and  fresh herbs. Meal planning  Plan to eat 1 vegetarian meal one day each week. Try to work up to 2 vegetarian meals, if possible. Eat seafood 2 or more times a week. Have healthy snacks readily available, such as: Vegetable sticks with hummus. Greek yogurt. Fruit and nut trail mix. Eat balanced meals throughout the week. This includes: Fruit: 2-3 servings a day Vegetables: 4-5 servings a day Low-fat dairy: 2 servings a day Fish, poultry, or lean meat: 1 serving a  day Beans and legumes: 2 or more servings a week Nuts and seeds: 1-2 servings a day Whole grains: 6-8 servings a day Extra-virgin olive oil: 3-4 servings a day Limit red meat and sweets to only a few servings a month What are my food choices? Mediterranean diet Recommended Grains: Whole-grain pasta. Brown rice. Bulgar wheat. Polenta. Couscous. Whole-wheat bread. Orpah Cobb. Vegetables: Artichokes. Beets. Broccoli. Cabbage. Carrots. Eggplant. Green beans. Chard. Kale. Spinach. Onions. Leeks. Peas. Squash. Tomatoes. Peppers. Radishes. Fruits: Apples. Apricots. Avocado. Berries. Bananas. Cherries. Dates. Figs. Grapes. Lemons. Melon. Oranges. Peaches. Plums. Pomegranate. Meats and other protein foods: Beans. Almonds. Sunflower seeds. Pine nuts. Peanuts. Cod. Salmon. Scallops. Shrimp. Tuna. Tilapia. Clams. Oysters. Eggs. Dairy: Low-fat milk. Cheese. Greek yogurt. Beverages: Water. Red wine. Herbal tea. Fats and oils: Extra virgin olive oil. Avocado oil. Grape seed oil. Sweets and desserts: [place] yogurt with honey. Baked apples. Poached pears. Trail mix. Seasoning and other foods: Basil. Cilantro. Coriander. Cumin. Mint. Parsley. Sage. Rosemary. Tarragon. Garlic. Oregano. Thyme. Pepper. Balsalmic vinegar. Tahini. Hummus. Tomato sauce. Olives. Mushrooms. Limit these Grains: Prepackaged pasta or rice dishes. Prepackaged cereal with added sugar. Vegetables: Deep fried potatoes (french fries). Fruits: Fruit canned in syrup. Meats and other protein foods: Beef. Pork. Lamb. Poultry with skin. Hot dogs. Tomasa Blase. Dairy: Ice cream. Sour cream. Whole milk. Beverages: Juice. Sugar-sweetened soft drinks. Beer. Liquor and spirits. Fats and oils: Butter. Canola oil. Vegetable oil. Beef fat (tallow). Lard. Sweets and desserts: Cookies. Cakes. Pies. Candy. Seasoning and other foods: Mayonnaise. Premade sauces and marinades. The items listed may not be a complete list. Talk with your dietitian about what  dietary choices are right for you. Summary The Mediterranean diet includes both food and lifestyle choices. Eat a variety of fresh fruits and vegetables, beans, nuts, seeds, and whole grains. Limit the amount of red meat and sweets that you eat. Talk with your health care provider about whether it is safe for you to drink red wine in moderation. This means 1 glass a day for nonpregnant women and 2 glasses a day for men. A glass of wine equals 5 oz (150 mL). This information is not intended to replace advice given to you by your health care provider. Make sure you discuss any questions you have with your health care provider. Document Released: 02/14/2016 Document Revised: 03/18/2016 Document Reviewed: 02/14/2016 Elsevier Interactive Patient Education  2017 [employer].

## 2023-08-20 NOTE — Progress Notes (Signed)
Assessment/Plan:   Dementia due to Alzheimer's disease, early onset  Devon Romero is a very pleasant 58 y.o. RH male with a history of hyperlipidemia, hypertension, hypothyroidism, history of prostate cancer s/p prostatectomy and a history of dementia due to Alzheimer's disease, early onset per neuropsych evaluation and positive lumbar puncture.  The patient has been seen at [facility] neurology not deeming him a candidate for agents like lecanemab or Donanemab do to the stage of the disease.    He is seen today in follow up for memory loss. Patient is currently on memantine 20 mg at night because his fiance cannot give it to him during the day as she works.  He is also on donepezil 23 mg nightly.  Tried to prescribe Namzaric for convenience, but the cost is extremely high.  Memory decline is noted, with some behavioral changes, tendency to wander off requiring more vigilance.  Unfortunately, his fiance works, does not have any home health nursing.  We introduced  our social worker, Dois Davenport Philips to discuss the situation, and also for future counseling due to caregiver distress.  This patient needs 24/7 care, possibilities would be attending adult day program for socialization and for safety, versus memory care, etc.  Follow up in 6  months. Continue memantine 20 mg nightly and donepezil 23 mg at night, side effects discussed Recommend good control of her cardiovascular risk factors Continue to control mood as per PCP     Subjective:    This patient is accompanied in the office by his fiance who supplements the history.  Previous records as well as any outside records available were reviewed prior to todays visit. Patient was last seen on 02/11/2023    Any changes in memory since last visit? " About the same".  Both LTM and STM are affected.  His fiance has taken some time off to take care of him, eventually having to return to work.  During the day he does household chores watches TV and  she is constantly monitoring him for safety. repeats oneself?  Endorsed at the last visit Disoriented when walking into a room?  Patient denies. Leaving objects?  May misplace things more frequently   Wandering behavior? Denies Any personality changes since last visit?  Denies.    Any worsening depression?:  Denies.   Hallucinations or paranoia? Sees mirrors and does not recognize the person in there.  Seizures? denies    Any sleep changes?  Sleeps well, may become more confused in the morning after waking up.  Denies vivid dreams or nightmares, REM behavior or sleepwalking   Sleep apnea?   Denies.   Any hygiene concerns?  Endorsed, has to be reminded to do so.  Sometimes he does not remember where  the hot or cold water are in the shower. Independent of bathing and dressing?  "He needs help to change otherwise he may put a T-shirt over the other " Does the patient needs help with medications?  Fiance is in charge, she is taking some time off to take care of him Who is in charge of the finances?  Fiance is in charge     Any changes in appetite?   He eats well but smaller portions    Patient have trouble swallowing? Denies.   Does the patient cook? No Any headaches?   denies   Chronic back pain  denies   Ambulates with difficulty? Denies.    Recent falls or head injuries? denies     Unilateral  weakness, numbness or tingling? denies   Any tremors?  Denies   Any anosmia?  Denies   Any incontinence of urine?  Endorsed, wears diapers Any bowel dysfunction?   Denies      Patient lives with his fiance  Does the patient drive? No longer drives     PREVIOUS MEDICATIONS:   CURRENT MEDICATIONS:  Outpatient Encounter Medications as of 08/20/2023  Medication Sig   Apoaequorin (PREVAGEN) 10 MG CAPS    atorvastatin (LIPITOR) 40 MG tablet Take 40 mg by mouth daily.   co-enzyme Q-10 30 MG capsule Take 30 mg by mouth 3 (three) times daily.   Coenzyme Q10 (CO Q-10) 300 MG CAPS 1 capsule with a  meal   tadalafil (CIALIS) 20 MG tablet Take by mouth.   [DISCONTINUED] donepezil (ARICEPT) 23 MG TABS tablet Take 1 tablet (23 mg total) by mouth at bedtime.   [DISCONTINUED] memantine (NAMENDA) 10 MG tablet Take 1 tablet at night   donepezil (ARICEPT) 23 MG TABS tablet Take 1 tablet (23 mg total) by mouth at bedtime.   memantine (NAMENDA) 10 MG tablet Take 1 tablet twice a day   No facility-administered encounter medications on file as of 08/20/2023.       04/23/2022   11:00 AM 10/22/2021    7:00 PM 12/02/2019    8:00 AM  MMSE - Mini Mental State Exam  Orientation to time 0 0 5  Orientation to Place 5 5 5   Registration 3 3 3   Attention/ Calculation 1 1 4   Recall 0 0 0  Language- name 2 objects 2 2 2   Language- repeat 1 1 0  Language- follow 3 step command 3 3 3   Language- read & follow direction 1 1 1   Write a sentence 1 1 0  Copy design 0 0 1  Total score 17 17 24        No data to display          Objective:     PHYSICAL EXAMINATION:    VITALS:   Vitals:   08/20/23 1430 08/20/23 1459  BP: (!) 160/90 122/81  Pulse: 63   Resp: 20   SpO2: 100%   Weight: 161 lb (73 kg)     GEN:  The patient appears stated age and is in NAD. HEENT:  Normocephalic, atraumatic.   Neurological examination:  General: NAD, well-groomed, appears stated age. Orientation: The patient is alert.  Not oriented to person, place and date Cranial nerves: There is good facial symmetry.The speech is fluent and clear, very verbose and tangential. No aphasia or dysarthria. Fund of knowledge is reduced. Recent and remote memory are impaired. Attention and concentration are reduced.  Able to name objects and unable repeat phrases as he loses the track of the conversation.  Hearing is intact to conversational tone.   Sensation: Sensation is intact to light touch throughout Motor: Strength is at least antigravity x4. DTR's 2/4 in UE/LE     Movement examination: Tone: There is normal tone in the  UE/LE Abnormal movements:  no tremor.  No myoclonus.  No asterixis.   Coordination:  There is some decremation with RAM's. Normal finger to nose  Gait and Station: The patient has no difficulty arising out of a deep-seated chair without the use of the hands. The patient's stride length is good.  Gait is cautious and narrow.    Thank you for allowing [place] the opportunity to participate in the care of this nice patient. Please do not hesitate  to contact us for any questions or concerns.   Total time spent on today's visit was 30 minutes dedicated to this patient today, preparing to see patient, examining the patient, ordering tests and/or medications and counseling the patient, documenting clinical information in the EHR or other health record, independently interpreting results and communicating results to the patient/family, discussing treatment and goals, answering patient's questions and coordinating care.  Cc:  Debroah Loop, Vinnie Langton [facility] 08/20/2023 4:37 PM

## 2023-09-15 ENCOUNTER — Ambulatory Visit: Payer: Self-pay | Admitting: Physician Assistant

## 2023-10-01 ENCOUNTER — Telehealth: Payer: Self-pay | Admitting: Physician Assistant

## 2023-10-01 NOTE — Telephone Encounter (Signed)
 I will talk to [facility] tomorrow for an update

## 2023-10-01 NOTE — Telephone Encounter (Signed)
 Pt's significant other called in stating she had met Devon Romero in the pt's last visit and is wanting to find out what services are offered?

## 2023-11-03 NOTE — Progress Notes (Signed)
 [facility] [employer] was not able to reach the patient by phone call.  The details of interventions are as follows:  Preventive Care-     11/03/2023    2:50 PM  Gap Closure  Age Group: Adult  Adult Primary Care Office Visit Unable to reach - MyChart Unavailable  Comments: lvm    Devon Romero  For more information on [facility] services, click here.

## 2023-12-18 ENCOUNTER — Telehealth: Payer: Self-pay | Admitting: Physician Assistant

## 2023-12-18 ENCOUNTER — Other Ambulatory Visit: Payer: Self-pay | Admitting: Physician Assistant

## 2023-12-18 MED ORDER — DIVALPROEX SODIUM 125 MG PO DR TAB: DELAYED_RELEASE_TABLET | ORAL | 11 refills | Status: DC

## 2023-12-18 NOTE — Telephone Encounter (Signed)
 Pt girlfriend called to speak with someone about greg's aggressive behavior. He seems to be coming more verbally  aggressive

## 2023-12-18 NOTE — Telephone Encounter (Signed)
 Patient advised and rx sent into pharmacy, thanked me for calling.

## 2024-01-25 ENCOUNTER — Encounter: Payer: Self-pay | Admitting: Physician Assistant

## 2024-01-25 ENCOUNTER — Ambulatory Visit: Admitting: Physician Assistant

## 2024-01-25 NOTE — Progress Notes (Incomplete)
 Assessment/Plan:   Dementia due to Alzheimer's Disease with behavioral disturbance, early onset***  Devon Romero is a very pleasant 58 y.o. RH male with a history off hyperlipidemia, hypertension, hypothyroidism, history of prostate cancer s/p prostatectomy and a history of dementia due to Alzheimer's disease, early onset per neuropsych evaluation and positive lumbar puncture, not a candidate for agents by lecanemab or donanemab due to stage of the disease.  He is seen today in follow up for memory loss. Patient is currently on memantine  20 mg at night because his fiance cannot give him a dose during the day as she works, and Namzaric is too expensive.  He is also on donepezil  23 mg nightly.  Memory decline is noted, with behavioral changes, needing 24/7 care, she needs more assistance with ADLs and close monitoring for safety.       Follow up in   months. Continue memantine  20 mg nightly and donepezil  23 mg at night, side effects discussed Continue*** Recommend good control of her cardiovascular risk factors Continue to control mood as per PCP     Subjective:    This patient is accompanied in the office by his fiance*** who supplements the history.  Previous records as well as any outside records available were reviewed prior to todays visit. Patient was last seen on 08/20/2023***   Any changes in memory since last visit? Devon Romero  Both short-term and long-term memory are affected.  He needs assistance with most ADLs, with close monitoring for safety repeats oneself?  Endorsed, as the last visit Disoriented when walking into a room? Denies ***  Leaving objects?  May misplace things more frequently***  Wandering behavior?  denies   Any personality changes since last visit?  Denies.   Any worsening depression?:  Denies.   Hallucinations or paranoia?  He sees the other person in the mirror and does not recognize himself as such.  Seizures? denies    Any sleep changes?  Sleeps well.   Denies vivid dreams, REM behavior or sleepwalking   Sleep apnea?   Denies.   Any hygiene concerns?  Has to be reminded, sometimes does not remember whether hot or cold water are located in the shower. Independent of bathing and dressing?  He needs help to change, otherwise he may put 1 T-shirt over the other. Does the patient needs help with medications? Fiance is in charge *** Who is in charge of the finances? Fiance is in charge   *** Any changes in appetite?  As before, he eats well but i but in small portions***   Patient have trouble swallowing? Denies.   Does the patient cook? No Any headaches?   denies   Any vision changes?*** Chronic back pain  denies   Ambulates with difficulty? Denies.  *** Recent falls or head injuries? Denies.     Unilateral weakness, numbness or tingling? denies   Any tremors?  Denies  *** Any anosmia?  Denies   Any incontinence of urine?  Endorsed***  Any bowel dysfunction?   Denies      Patient lives   *** Does the patient drive? No longer drives ***   PREVIOUS MEDICATIONS:   CURRENT MEDICATIONS:  Outpatient Encounter Medications as of 01/25/2024  Medication Sig   Apoaequorin (PREVAGEN) 10 MG CAPS    atorvastatin (LIPITOR) 40 MG tablet Take 40 mg by mouth daily.   co-enzyme Q-10 30 MG capsule Take 30 mg by mouth 3 (three) times daily.   Coenzyme Q10 (CO Q-10) 300  MG CAPS 1 capsule with a meal   divalproex  (DEPAKOTE ) 125 MG DR tablet Depakote  mg Take 1 tab at night , may increase to 1 tab twice a day if needed   donepezil  (ARICEPT ) 23 MG TABS tablet Take 1 tablet (23 mg total) by mouth at bedtime.   memantine  (NAMENDA ) 10 MG tablet Take 1 tablet twice a day   tadalafil (CIALIS) 20 MG tablet Take by mouth.   No facility-administered encounter medications on file as of 01/25/2024.       04/23/2022   11:00 AM 10/22/2021    7:00 PM 12/02/2019    8:00 AM  MMSE - Mini Mental State Exam  Orientation to time 0 0 5  Orientation to Place 5 5 5    Registration 3 3 3   Attention/ Calculation 1 1 4   Recall 0 0 0  Language- name 2 objects 2 2 2   Language- repeat 1 1 0  Language- follow 3 step command 3 3 3   Language- read & follow direction 1 1 1   Write a sentence 1 1 0  Copy design 0 0 1  Total score 17 17 24        No data to display          Objective:     PHYSICAL EXAMINATION:    VITALS:  There were no vitals filed for this visit.  GEN:  The patient appears stated age and is in NAD. HEENT:  Normocephalic, atraumatic.   Neurological examination:  General: NAD, well-groomed, appears stated age. Orientation: The patient is alert. Oriented to person, place and date Cranial nerves: There is good facial symmetry.The speech is fluent and clear, very tangential. No aphasia or dysarthria. Fund of knowledge is appropriate. Recent and remote memory are impaired. Attention and concentration are reduced. Able to name objects and unable to repeat phrases.  Hearing is intact to conversational tone. *** Sensation: Sensation is intact to light touch throughout Motor: Strength is at least antigravity x4. DTR's 2/4 in UE/LE     Movement examination: Tone: There is normal tone in the UE/LE Abnormal movements:  no tremor.  No myoclonus.  No asterixis.   Coordination:  There is no decremation with RAM's. Normal finger to nose  Gait and Station: The patient has no*** difficulty arising out of a deep-seated chair without the use of the hands. The patient's stride length is good.  Gait is cautious and narrow.    Thank you for allowing us  the opportunity to participate in the care of this nice patient. Please do not hesitate to contact us  for any questions or concerns.   Total time spent on today's visit was *** minutes dedicated to this patient today, preparing to see patient, examining the patient, ordering tests and/or medications and counseling the patient, documenting clinical information in the EHR or other health record,  independently interpreting results and communicating results to the patient/family, discussing treatment and goals, answering patient's questions and coordinating care.  Cc:  Auston Opal, DO  [facility] 01/25/2024 6:15 AM

## 2024-02-17 ENCOUNTER — Ambulatory Visit: Payer: Medicare Other | Admitting: Physician Assistant

## 2024-04-29 ENCOUNTER — Other Ambulatory Visit: Payer: Self-pay | Admitting: Physician Assistant

## 2024-05-05 ENCOUNTER — Ambulatory Visit: Admitting: Physician Assistant

## 2024-05-17 ENCOUNTER — Encounter: Payer: Self-pay | Admitting: Physician Assistant

## 2024-05-17 ENCOUNTER — Ambulatory Visit (INDEPENDENT_AMBULATORY_CARE_PROVIDER_SITE_OTHER): Admitting: Physician Assistant

## 2024-05-17 VITALS — HR 94 | Resp 20 | Wt 153.0 lb

## 2024-05-17 DIAGNOSIS — F0280 Dementia in other diseases classified elsewhere without behavioral disturbance: Secondary | ICD-10-CM

## 2024-05-17 DIAGNOSIS — G300 Alzheimer's disease with early onset: Secondary | ICD-10-CM | POA: Diagnosis not present

## 2024-05-17 MED ORDER — BREXPIPRAZOLE 2 MG PO TABS: ORAL_TABLET | ORAL | 11 refills | Status: DC

## 2024-05-17 NOTE — Progress Notes (Signed)
 Assessment/Plan:   Dementia likely due to Alzheimer's disease, early onset with behavioral disturbance  Devon Romero is a very pleasant 58 y.o. RH male with a history of hyperlipidemia, hypertension, hypothyroidism, history of prostate cancer s/p prostatectomy and a history of dementia due to Alzheimer's disease, early onset per neuropsych evaluation and positive lumbar puncture. The patient has been seen at [facility] neurology not deeming him a candidate for agents like lecanemab or Donanemab do to the advanced stage of the disease. He is seen today in follow up for memory loss. Patient is currently on memantine  20 mg daily per fiancee's preference, and donepezil  23 mg nightly.  Memory decline is noted, needing full assistance with ADLs. He needs 24/7 care with [facility] versus memory care for safety, cognitive and social stimulation.  We discussed his issues with his fiance and she is going to entertain the possibilities, as this has become very hard for her to be the sole care provider.  Patient's mood has become increasingly irritable, and agitated.  We discussed starting Rexulti if not cost prohibitive.  Will provide some samples and observe.   Follow up in 6 months. Discontinue memantine  20 mg daily and donepezil  23 mg at night as these are no longer therapeutic, and the risk of the medicine outweigh the benefits. Start Rexulti 0.5 mg daily for 1 week, then increase to 1 mg daily for 1 week, then 2 mg daily thereafter, side effects discussed.  Samples to be provided Recommend good control of her cardiovascular risk factors Continue to control mood as per PCP     Subjective:    This patient is accompanied in the office by his fiancee  who supplements the history.  Previous records as well as any outside records available were reviewed prior to todays visit. Patient was last seen on 08/20/23   Any changes in memory since last visit? Worse cannot carry conversation. Both STM And LTM affected.   Unfortunately, not recognize his fiance, or the place that he lives at.  Sometimes he does not want to leave the house so I cannot take it to the appointments -his wife says.  He likes watching TV, fiancee monitors for safety.  repeats oneself?  Endorsed [place].  This was limits during this visit.   Disoriented when walking into a room?  Yes, he does not recognize the rooms in his house. Leaving objects?  He does misplace things throughout the house.  Wandering behavior? Wandering tendencies, leaves the house and she has to pick him up.  Neighbors are aware of his they are until his fiance picks him up. Any personality changes since last visit? He is more tangential than before.  More agitated, may elbow you, quick to anger this was witnessed during this visit).  Sometimes he does not want to leave the house last week he did not know to come visit so I had to reschedule. Any worsening depression?:  Denies.   Hallucinations or paranoia? He does not recognize the person in the mirror.  He also tries to pick up objects from the floor that they are not there this is witnessed during this visit. Seizures? denies    Any sleep changes?  Sleeps fairly well.  Denies vivid dreams, REM behavior or sleepwalking   Sleep apnea?   Denies.   Any hygiene concerns? Endorsed, but using music helps  Independent of bathing and dressing?  Needs assistance otherwise will put clothes upside down more inside out. Does the patient needs help with  medications?  Fiancee is in charge, sometimes he does not want to take it because dose not believe is from the doctor. Who is in charge of the finances?  [place] is in charge     Any changes in appetite? He does not like to eat meals, small bites through the day     Patient have trouble swallowing? Denies.   Does the patient cook? No Any headaches?   denies   Any vision changes?  Denies Chronic back pain  denies   Ambulates with difficulty?  Endorsed.  He is walking  much slower  Recent falls or head injuries? Denies.     Unilateral weakness, numbness or tingling? denies   Any tremors?  Denies    Any anosmia?  Denies   Any incontinence of urine?  Endorsed   Any bowel dysfunction?  Denies      Patient lives with fiancee    PREVIOUS MEDICATIONS:   CURRENT MEDICATIONS:  Outpatient Encounter Medications as of 05/17/2024  Medication Sig   brexpiprazole (REXULTI) 2 MG TABS tablet Take 0.5 mg for 1 week, then 1 mg for 1 week, then 2 mg daily   Apoaequorin (PREVAGEN) 10 MG CAPS    atorvastatin (LIPITOR) 40 MG tablet Take 40 mg by mouth daily.   co-enzyme Q-10 30 MG capsule Take 30 mg by mouth 3 (three) times daily.   Coenzyme Q10 (CO Q-10) 300 MG CAPS 1 capsule with a meal   divalproex  (DEPAKOTE ) 125 MG DR tablet Depakote  mg Take 1 tab at night , may increase to 1 tab twice a day if needed   tadalafil (CIALIS) 20 MG tablet Take by mouth.   [DISCONTINUED] donepezil  (ARICEPT ) 23 MG TABS tablet Take 1 tablet (23 mg total) by mouth at bedtime.   [DISCONTINUED] memantine  (NAMENDA ) 10 MG tablet TAKE 1 TABLET BY MOUTH AT NIGHT   No facility-administered encounter medications on file as of 05/17/2024.       04/23/2022   11:00 AM 10/22/2021    7:00 PM 12/02/2019    8:00 AM  MMSE - Mini Mental State Exam  Orientation to time 0 0 5  Orientation to Place 5 5 5   Registration 3 3 3   Attention/ Calculation 1 1 4   Recall 0 0 0  Language- name 2 objects 2 2 2   Language- repeat 1 1 0  Language- follow 3 step command 3 3 3   Language- read & follow direction 1 1 1   Write a sentence 1 1 0  Copy design 0 0 1  Total score 17 17 24        No data to display          Objective:     PHYSICAL EXAMINATION:    VITALS:   Vitals:   05/17/24 1515  Pulse: 94  Resp: 20  SpO2: 98%  Weight: 153 lb (69.4 kg)    GEN:  The patient appears stated age and is in NAD but anxious appearing. HEENT:  Normocephalic, atraumatic.   Neurological  examination:  General: NAD, well-groomed, appears stated age. Orientation: The patient is alert.  Not oriented to person, place and date Cranial nerves: There is good facial symmetry.The speech is fluent and clear, very tangential. No aphasia or dysarthria. Fund of knowledge is reduced.  Recent and remote memory are impaired. Attention and concentration are reduced.  Unable to name objects and unable to repeat phrases.  Hearing is intact to conversational tone.   Sensation: Sensation is intact to light touch  throughout Motor: Strength is at least antigravity x4. DTR's 2/4 in UE/LE     Movement examination: Tone: There is normal tone in the UE/LE Abnormal movements:  no tremor.  No myoclonus.  No asterixis.   Coordination:  There is o decremation with RAM's abnormal  finger to nose  Gait and Station: The patient has no difficulty arising out of a deep-seated chair without the use of the hands. The patient's stride length is short, gait is cautious and narrow.    Thank you for allowing us  the opportunity to participate in the care of this nice patient. Please do not hesitate to contact us  for any questions or concerns.   Total time spent on today's visit was 20 minutes dedicated to this patient today, preparing to see patient, examining the patient, ordering tests and/or medications and counseling the patient, documenting clinical information in the EHR or other health record, independently interpreting results and communicating results to the patient/family, discussing treatment and goals, answering patient's questions and coordinating care.  Cc:  Auston Opal, DO  [facility] 05/18/2024 6:30 AM

## 2024-05-17 NOTE — Patient Instructions (Signed)
 Discontinue the memory pills.  Start rexulti as directed Consider memory care

## 2024-05-19 ENCOUNTER — Telehealth: Payer: Self-pay | Admitting: Physician Assistant

## 2024-05-19 ENCOUNTER — Other Ambulatory Visit: Payer: Self-pay | Admitting: Physician Assistant

## 2024-05-19 MED ORDER — BREXPIPRAZOLE 2 MG PO TABS: 2.0000 mg | ORAL_TABLET | Freq: Every day | ORAL | 3 refills | Status: DC

## 2024-05-19 NOTE — Telephone Encounter (Signed)
 Pt would like .5 of Rx brexpiprazole (REXULTI) 2 MG TABS tablet  sent in so Pt does not have cut pills to do staggered dosages for the 1st week of taking Rx as rqstd by [facility], any questions call spouse

## 2024-05-19 NOTE — Progress Notes (Signed)
 Rexulti 2 mg prescription written.  Tritrating 14 day sample given

## 2024-05-20 NOTE — Telephone Encounter (Addendum)
 Sample given of rexulti 0.5mg  day-7 1mg  per tablet 8-14 one box 14 tablets.lot [id number] expires 07/2024 per [facility] RIGGERS.

## 2024-06-11 ENCOUNTER — Emergency Department (HOSPITAL_COMMUNITY)
Admission: EM | Admit: 2024-06-11 | Discharge: 2024-06-16 | Disposition: A | Discharge status: Home / Self Care | Attending: Emergency Medicine | Admitting: Emergency Medicine

## 2024-06-11 DIAGNOSIS — G300 Alzheimer's disease with early onset: Secondary | ICD-10-CM | POA: Diagnosis not present

## 2024-06-11 DIAGNOSIS — F03918 Unspecified dementia, unspecified severity, with other behavioral disturbance: Secondary | ICD-10-CM | POA: Diagnosis present

## 2024-06-11 DIAGNOSIS — F02818 Dementia in other diseases classified elsewhere, unspecified severity, with other behavioral disturbance: Secondary | ICD-10-CM | POA: Diagnosis not present

## 2024-06-11 DIAGNOSIS — Z79899 Other long term (current) drug therapy: Secondary | ICD-10-CM | POA: Diagnosis not present

## 2024-06-11 DIAGNOSIS — R4689 Other symptoms and signs involving appearance and behavior: Secondary | ICD-10-CM

## 2024-06-11 DIAGNOSIS — R456 Violent behavior: Secondary | ICD-10-CM | POA: Diagnosis present

## 2024-06-11 DIAGNOSIS — I10 Essential (primary) hypertension: Secondary | ICD-10-CM | POA: Diagnosis not present

## 2024-06-11 DIAGNOSIS — Z8546 Personal history of malignant neoplasm of prostate: Secondary | ICD-10-CM | POA: Diagnosis not present

## 2024-06-11 DIAGNOSIS — E039 Hypothyroidism, unspecified: Secondary | ICD-10-CM | POA: Diagnosis not present

## 2024-06-11 LAB — COMPREHENSIVE METABOLIC PANEL WITH GFR
ALT: 20 U/L (ref 0–44)
AST: 37 U/L (ref 15–41)
Albumin: 4.6 g/dL (ref 3.5–5.0)
Alkaline Phosphatase: 74 U/L (ref 38–126)
Anion gap: 10 (ref 5–15)
BUN: 15 mg/dL (ref 6–20)
CO2: 29 mmol/L (ref 22–32)
Calcium: 9.6 mg/dL (ref 8.9–10.3)
Chloride: 103 mmol/L (ref 98–111)
Creatinine, Ser: 1.45 mg/dL — ABNORMAL HIGH (ref 0.61–1.24)
GFR, Estimated: 55 mL/min — ABNORMAL LOW (ref >60)
Glucose, Bld: 101 mg/dL — ABNORMAL HIGH (ref 70–99)
Potassium: 3.7 mmol/L (ref 3.5–5.1)
Sodium: 141 mmol/L (ref 135–145)
Total Bilirubin: 0.6 mg/dL (ref 0.0–1.2)
Total Protein: 7.7 g/dL (ref 6.5–8.1)

## 2024-06-11 LAB — CBC
HCT: 42.6 % (ref 39.0–52.0)
Hemoglobin: 13.8 g/dL (ref 13.0–17.0)
MCH: 30.1 pg (ref 26.0–34.0)
MCHC: 32.4 g/dL (ref 30.0–36.0)
MCV: 92.8 fL (ref 80.0–100.0)
Platelets: 192 K/uL (ref 150–400)
RBC: 4.59 MIL/uL (ref 4.22–5.81)
RDW: 13.5 % (ref 11.5–15.5)
WBC: 7.1 K/uL (ref 4.0–10.5)
nRBC: 0 % (ref 0.0–0.2)

## 2024-06-11 LAB — ETHANOL: Alcohol, Ethyl (B): <15 mg/dL (ref <15)

## 2024-06-11 MED ORDER — HALOPERIDOL LACTATE 5 MG/ML IJ SOLN
5.0000 mg | Freq: Once | INTRAMUSCULAR | Status: AC
  Administered 2024-06-11: 5 mg via INTRAMUSCULAR
  Filled 2024-06-11: qty 1

## 2024-06-11 NOTE — ED Provider Notes (Signed)
 Care assumed at 2300.
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
## 2024-06-12 DIAGNOSIS — F03918 Unspecified dementia, unspecified severity, with other behavioral disturbance: Secondary | ICD-10-CM | POA: Diagnosis not present

## 2024-06-12 DIAGNOSIS — G300 Alzheimer's disease with early onset: Secondary | ICD-10-CM | POA: Diagnosis not present

## 2024-06-12 LAB — URINALYSIS, ROUTINE W REFLEX MICROSCOPIC
Bilirubin Urine: NEGATIVE
Glucose, UA: NEGATIVE mg/dL
Ketones, ur: NEGATIVE mg/dL
Leukocytes,Ua: NEGATIVE
Nitrite: NEGATIVE
Protein, ur: NEGATIVE mg/dL
Specific Gravity, Urine: 1.013 (ref 1.005–1.030)
pH: 5 (ref 5.0–8.0)

## 2024-06-12 LAB — URINE DRUG SCREEN
Amphetamines: NEGATIVE
Barbiturates: NEGATIVE
Benzodiazepines: NEGATIVE
Cocaine: NEGATIVE
Fentanyl: NEGATIVE
Methadone Scn, Ur: NEGATIVE
Opiates: NEGATIVE
Tetrahydrocannabinol: NEGATIVE

## 2024-06-12 MED ORDER — HALOPERIDOL 5 MG PO TABS: 5.0000 mg | ORAL_TABLET | Freq: Once | ORAL | Status: DC

## 2024-06-12 MED ORDER — ZIPRASIDONE MESYLATE 20 MG IM SOLR
10.0000 mg | Freq: Once | INTRAMUSCULAR | Status: AC
  Administered 2024-06-12: 10 mg via INTRAMUSCULAR
  Filled 2024-06-12: qty 20

## 2024-06-12 MED ORDER — DIVALPROEX SODIUM 125 MG PO DR TAB
125.0000 mg | DELAYED_RELEASE_TABLET | Freq: Every day | ORAL | Status: DC
  Administered 2024-06-12: 125 mg via ORAL
  Filled 2024-06-12 (×2): qty 1

## 2024-06-12 MED ORDER — STERILE WATER FOR INJECTION IJ SOLN
INTRAMUSCULAR | Status: AC
  Filled 2024-06-12: qty 10

## 2024-06-12 MED ORDER — LORAZEPAM 1 MG PO TABS
1.0000 mg | ORAL_TABLET | Freq: Once | ORAL | Status: AC
  Administered 2024-06-12: 1 mg via ORAL
  Filled 2024-06-12: qty 1

## 2024-06-12 MED ORDER — HYDROXYZINE HCL 25 MG PO TABS
25.0000 mg | ORAL_TABLET | Freq: Three times a day (TID) | ORAL | Status: DC | PRN
  Administered 2024-06-12 – 2024-06-13 (×2): 25 mg via ORAL
  Filled 2024-06-12 (×2): qty 1

## 2024-06-12 MED ORDER — TRAZODONE HCL 50 MG PO TABS: 50.0000 mg | ORAL_TABLET | Freq: Once | ORAL | Status: DC

## 2024-06-12 MED ORDER — QUETIAPINE FUMARATE 25 MG PO TABS
25.0000 mg | ORAL_TABLET | Freq: Every day | ORAL | Status: DC
  Administered 2024-06-12: 25 mg via ORAL
  Filled 2024-06-12: qty 1

## 2024-06-12 NOTE — ED Notes (Signed)
 Patient walking around stating things are not right. Redirection unsuccessful. PRN med given.
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
## 2024-06-12 NOTE — ED Notes (Signed)
 Pt has been dressed out (2 belongings bags labeled). Pts belonging are in cabinet labeled [address]). Pt has not been wanded yet.

## 2024-06-12 NOTE — ED Notes (Signed)
 Patient up and urinating in the floor, clothes changed, floor cleaned, and patient redirected back to bed. Refused any snacks or fluids

## 2024-06-12 NOTE — ED Provider Notes (Signed)
 Is having some agitation, EKG is reassuring from a QTc standpoint.  Will give him a dose of IM Geodon .   Ruthe Cornet, DO 06/12/24 2304

## 2024-06-12 NOTE — ED Notes (Signed)
 Patient continues to be agitated, redirection unsuccessful. MD made aware new orders placed.

## 2024-06-13 ENCOUNTER — Encounter (HOSPITAL_COMMUNITY): Payer: Self-pay

## 2024-06-13 ENCOUNTER — Other Ambulatory Visit: Payer: Self-pay

## 2024-06-13 DIAGNOSIS — F03918 Unspecified dementia, unspecified severity, with other behavioral disturbance: Secondary | ICD-10-CM

## 2024-06-13 DIAGNOSIS — G300 Alzheimer's disease with early onset: Secondary | ICD-10-CM | POA: Diagnosis not present

## 2024-06-13 MED ORDER — QUETIAPINE FUMARATE 50 MG PO TABS
25.0000 mg | ORAL_TABLET | Freq: Two times a day (BID) | ORAL | Status: DC | PRN
  Filled 2024-06-13: qty 1

## 2024-06-13 MED ORDER — DIVALPROEX SODIUM 125 MG PO CSDR
250.0000 mg | DELAYED_RELEASE_CAPSULE | Freq: Every day | ORAL | Status: DC
  Administered 2024-06-13 – 2024-06-15 (×3): 250 mg via ORAL
  Filled 2024-06-13 (×3): qty 2

## 2024-06-13 MED ORDER — QUETIAPINE FUMARATE 25 MG PO TABS
25.0000 mg | ORAL_TABLET | Freq: Once | ORAL | Status: AC
  Administered 2024-06-13: 25 mg via ORAL
  Filled 2024-06-13: qty 1

## 2024-06-13 MED ORDER — DIVALPROEX SODIUM 500 MG PO DR TAB: 500.0000 mg | DELAYED_RELEASE_TABLET | Freq: Every day | ORAL | Status: DC

## 2024-06-13 MED ORDER — QUETIAPINE FUMARATE 50 MG PO TABS
50.0000 mg | ORAL_TABLET | Freq: Every day | ORAL | Status: DC
  Administered 2024-06-13 – 2024-06-15 (×3): 50 mg via ORAL
  Filled 2024-06-13 (×3): qty 1

## 2024-06-13 NOTE — ED Notes (Signed)
 Patient awake at this time, Noted to be repositioning self comfortably in bed. Sitter at bedside.
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
## 2024-06-13 NOTE — ED Notes (Signed)
 Patient continually walking out of room. Easily redirectable into room. PRN meds given for agitation.
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
## 2024-06-13 NOTE — ED Notes (Signed)
 The patient walked out of his room multiple times and attempted to walk towards the exit. He said he was leaving. He was initially able to be redirected briefly but it would not last long before he tried again. It became harder to redirect him and get him to go back to his room

## 2024-06-13 NOTE — ED Notes (Signed)
 Patient in bed resting with eyes closed.

## 2024-06-13 NOTE — ED Notes (Signed)
 Patient very agitated and combative redirection unsuccessful. Patient attempted to urinate on staff. MD made aware new orders given.

## 2024-06-13 NOTE — Progress Notes (Signed)
 Inpatient Psychiatric Referral  Patient was recommended inpatient per Jadeka Motley-Mangrum, NP. There are no available beds at [facility], per [facility] . Patient was referred to the following out of network facilities:  [facility] Provider Address Phone Fax  [facility]  [address] [place] [address] [phone]  [facility]  [address] [place] [address] [phone]  [facility]  [address] [place], [place] [address] [phone]  [facility]  [address] [place], [place] [address] [phone]  [facility]  [address] [place] [address] [phone]  [facility]  [address] [place] [address] [phone]  [facility]  [address], [place] [address] [phone]  [facility]  [address], [place] [address] [phone]  [facility] EFAX  [address] [place], [place] [phone]  [facility]  [address], [place] [address] [phone]  [facility]  [address] [place] [address] [phone]    Situation ongoing, CSW to continue following and update chart as more information becomes available.   Harrie Sofia MSW, [place] 06/13/2024

## 2024-06-13 NOTE — ED Provider Notes (Signed)
 Emergency Medicine Observation Re-evaluation Note  Devon Romero is a 58 y.o. male, seen on rounds today.  Pt initially presented to the ED for complaints of No chief complaint on file. Currently, the patient is asleep, did require Geodon  overnight.  Physical Exam  BP (!) 181/96 (BP Location: Right Arm)   Pulse 62   Temp 98.6 F (37 C)   Resp 18   SpO2 100%  Physical Exam General: Asleep Cardiac: Rate normal Lungs: Clear lungs Psych: Unable to assess  ED Course / MDM  EKG:EKG Interpretation Date/Time:  Sunday June 12 2024 04:12:47 EST Ventricular Rate:  60 PR Interval:  166 QRS Duration:  82 QT Interval:  414 QTC Calculation: 414 R Axis:   16  Text Interpretation: Sinus rhythm Minimal ST elevation, anterior leads Confirmed by Griselda Norris [phone]) on 06/12/2024 5:28:16 AM  I have reviewed the labs performed to date as well as medications administered while in observation.  Recent changes in the last 24 hours include patient requiring Geodon  for sedation.  Given Medicaid status, was unable to evaluate the patient, however he has normal vital signs, clear lungs, does not appear to be in any acute distress..  Plan  Current plan is for psychiatry evaluation.    Mannie Pac T, DO 06/13/24 [phone]

## 2024-06-14 DIAGNOSIS — G300 Alzheimer's disease with early onset: Secondary | ICD-10-CM | POA: Diagnosis not present

## 2024-06-14 NOTE — ED Notes (Signed)
 Patient had a decent night with sleeping for some time after medications were given. Patient will periodically get up and can be redirected back into the room with NT with some coaching from staff. Patient has not showed any signs of aggression tonight and has been cooperative with staff. Patient does need some coaching from staff in regards to medication administration and staying in his room. Paient is resting at this time with no complaints.
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
## 2024-06-14 NOTE — ED Notes (Signed)
 Pt fiance called to check on patient and ask about visiting. I did let her know about his condition today and that he seems to be doing better after breakfast.

## 2024-06-14 NOTE — Progress Notes (Signed)
 LCSW Progress Note  [id number]   Devon Romero  06/14/2024  3:35 PM  Description:   Inpatient Psychiatric Referral  Patient was recommended inpatient per Jadeka Mangrum  (NP). There are no available beds at [facility], per [facility] Carlo RN). Patient was referred to the following out of network facilities:   [facility] Provider Address Phone Fax  [facility]  [address] [place] [address] [phone]  [facility]  [address] [place] [address] [phone]  [facility]  [address] [place], [place] [address] [phone]  [facility]  [address] [place], [place] [address] [phone]  [facility]  [address] [place] [address] [phone]  [facility]  [address], [place] [address] [phone]  [facility]  [address], [place] [address] [phone]  [facility]  [address], [place] [address] [phone]  [facility] EFAX  [address] [place], [place] [phone]  [facility]  [address] [place] [address] [phone]  [facility]  [address] [place] [address] [phone]      Situation ongoing, CSW to continue following and update chart as more information becomes available.      Tunisia Veta Dambrosia, MSW, LCSW  06/14/2024 3:35 PM
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
## 2024-06-14 NOTE — Progress Notes (Signed)
 Inpatient Psychiatric Referral  Patient was recommended inpatient per Jadeka Motley-Mangrum, NP. There are no available beds at [facility], per [facility]. Patient was referred to the following out of network facilities:  [place] Provider Address Phone Fax  [facility]  [address] [place] [address] [phone]  [facility]  [address] [place] [address] [phone]  [facility]  [address] [place], [place] [address] [phone]  [facility]  [address] [place], [place] [address] [phone]  [facility]  [address] [place] [address] [phone]  [facility]  [address] [place] [address] [phone]  [facility]  [address], [place] [address] [phone]  [facility]  [address], [place] [address] [phone]  [facility]FA[facility]  [address] [place], [place] [phone]  [facility]  [address], [place] [address] [phone]  [facility]  [address] [place] [address] [phone]    Situation ongoing, CSW to continue following and update chart as more information becomes available.  Harrie Sofia MSW, [place] 06/14/2024

## 2024-06-14 NOTE — ED Provider Notes (Signed)
 Emergency Medicine Observation Re-evaluation Note  Devon Romero is a 58 y.o. male, seen on rounds today.  Pt initially presented to the ED for complaints of No chief complaint on file. Currently, the patient is asleep.  Physical Exam  BP (!) 142/86 (BP Location: Right Arm)   Pulse 71   Temp 98.4 F (36.9 C) (Oral)   Resp 19   SpO2 100%  Physical Exam General: NAD Cardiac: Rate normal Lungs: Respirations even and unlabored Psych:No agitation  ED Course / MDM  EKG:EKG Interpretation Date/Time:  Sunday June 12 2024 04:12:47 EST Ventricular Rate:  60 PR Interval:  166 QRS Duration:  82 QT Interval:  414 QTC Calculation: 414 R Axis:   16  Text Interpretation: Sinus rhythm Minimal ST elevation, anterior leads Confirmed by Griselda Norris [id number]) on 06/12/2024 5:28:16 AM  I have reviewed the labs performed to date as well as medications administered while in observation.  Recent changes in the last 24 hours include evaluated by psychiatry, recommendations include increasing Seroquel  to 50mg  PO at bedtime, Depakote  250mg  at bedtime, Seroquel  25mg  PO bid PRN for agitation. Psychiatry recommended inpatient.  Plan  Current plan is for inpatient psychiatric management.        Jerrol Agent, MD 06/14/24 [phone]

## 2024-06-15 ENCOUNTER — Telehealth: Payer: Self-pay | Admitting: Physician Assistant

## 2024-06-15 DIAGNOSIS — G300 Alzheimer's disease with early onset: Secondary | ICD-10-CM | POA: Diagnosis not present
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
## 2024-06-15 NOTE — ED Notes (Addendum)
 Patient complains of pain in his face and throat on the right side. No relief with drinking water . MD notified.

## 2024-06-15 NOTE — Telephone Encounter (Signed)
 No answer at 2:45pm  06/15/2024
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
## 2024-06-15 NOTE — Consult Note (Signed)
  Psychiatric Consult Follow-up  Patient Name: .Devon Romero  MRN: [id number]  DOB: Nov 15, 1965  Consult Order details:  Orders (From admission, onward)     Start     Ordered   06/12/24 0025  CONSULT TO CALL ACT TEAM       Ordering Provider: Griselda Norris, MD  Provider:  (Not yet assigned)  Question:  Reason for Consult?  Answer:  IVC   06/12/24 0024             Mode of Visit: In person    Psychiatry Consult Evaluation  Service Date: June 15, 2024 LOS: 4 days  Chief Complaint Increased agitation and aggression following medication change  Primary Psychiatric Diagnoses  Early-onset Alzheimers disease with behavioral disturbance  Assessment  Devon Romero is a 58 y.o. male admitted: Presented to the EDfor 06/11/2024  9:43 PM for increased agitation and aggression following a medication change. He has no psychiatric history and has a past medical history of prostate cancer, decreased kidney function, Alzheimer's dementia.   Patient is a 58 year old male with early-onset Alzheimers disease who initially presented to the ED with severe cognitive impairment, intermittent agitation, impulse behavior, and aggression toward his fiance.   The patient has shown improvement in cognitive status, mood, and behaviors following medication adjustments over the past 24 hours. He has not exhibited any aggressive, disruptive, self-harm, or psychotic behaviors. He is alert and oriented to self and date of birth; however, he remains disoriented to place, time, and situation.   The patient denies suicidal ideation, homicidal ideation and auditory or visual hallucinations. He also denies feelings of agitation or anger. He denies difficulty eating or sleeping. He acknowledges ongoing confusion, stating that he usually can remember things.   He has been medication compliant with no noticeable or reported side effects.   Diagnoses:  Active Hospital problems: Principal  Problem:   Dementia with behavioral disturbance (HCC)    Plan   ## Psychiatric Medication Recommendations:  Continue Seroquel  to 50 mg p.o. at bedtime for mood (monitor for oversedation, hypotension) Continue Depakote  to 250 mg at night p.o. for mood   ## Medical Decision Making Capacity: Not specifically addressed in this encounter Due to documented history of dementia, the patient has impaired capacity to independently make informed decisions at this time.    ## Further Work-up:  Add valproic acid level:   On admission, lab values appear normal, AST is 37 and ALT is 20, sodium is 141 and potassium is 3.7, WBCs are 7.1; UA ordered on 06/12/24 and no signs of UTI  -- most recent EKG on 06/12/2024 had QtC of 414 -- Pertinent labwork reviewed earlier this admission includes: CBC, CMP, UA, UDS, EKG  ## Disposition:--The patient was initially recommended for inpatient psychiatric treatment;  however, due to primary history of dementia, psychiatric placement has been a barrier. The patient has been observed and treated over the course of 4 days with medication adjustments, reorientation and safety precautions. At this time, patient has not exhibited any aggressive, defensive, psychotic or self harm behaviors and has responded well to medication adjustments. He is psychiatrically stable and cleared for discharge to return home with safety precautions in place. The following safety measures have been recommended to [employer] (pt's girlfriend) as followed: The patient should not have access to firearms, medications should be securely stored, sharp objects should be restricted, and access to hazardous chemical should be restricted. The patient is to continue follow up with Neurology for on  going evaluation and medication management. Due to history of dementia, he may benefit from placement in a memory care unit for support and structured environment. TOC consult was ordered to assist with discharge  planning/community resources. [facility] was contacted to schedule post discharge follow up appointment and will contact Shonita to schedule appointment.   ## Behavioral / Environmental: -Delirium Precautions: Delirium Interventions for Nursing and Staff: - RN to open blinds every AM. - To Bedside: Glasses, hearing aide, and pt's own shoes. Make available to patients. when possible and encourage use. - Encourage po fluids when appropriate, keep fluids within reach. - OOB to chair with meals. - Passive ROM exercises to all extremities with AM & PM care. - RN to assess orientation to person, time and place QAM and PRN. - Recommend extended visitation hours with familiar family/friends as feasible. - Staff to minimize disturbances at night. Turn off television when pt asleep or when not in use.   ## Safety and Observation Level:  - Based on my clinical evaluation, I estimate the patient to be at low risk of self harm in the current setting. - At this time, we recommend  1:1 Observation due to history of dementia. This decision is based on my review of the chart including patient's history and current presentation, interview of the patient, mental status examination, and consideration of suicide risk including evaluating suicidal ideation, plan, intent, suicidal or self-harm behaviors, risk factors, and protective factors. This judgment is based on our ability to directly address suicide risk, implement suicide prevention strategies, and develop a safety plan while the patient is in the clinical setting. Please contact our team if there is a concern that risk level has changed.  CSSR Risk Category:C-SSRS RISK CATEGORY: No Risk  Suicide Risk Assessment: Patient has following modifiable risk factors for suicide: none, which we are addressing by recommending ongoing follow up with Neurology.  Patient has following non-modifiable or demographic risk factors for suicide: male gender Patient has the  following protective factors against suicide: Supportive family and Supportive friends  Thank you for this consult request. Recommendations have been communicated to the primary team.  We will sign off at this time.   Teresa Wyline CROME, NP       History of Present Illness  Relevant Aspects of Hospital ED Course:  Admitted on 06/11/2024 for Increased agitation and aggression following medication change.  Patient Report:   On approach, patient is seated in no acute distress while eating lunch. He is alert and oriented to full name and DOB. He is disoriented to place, time and situation. He is aware of his inability to recall information and states that he usually can remember things but he is having a hard time remembering things today. He does not know why he is in the hospital. He denies suicidal ideation, homicidal ideation and auditory or visual hallucinations. He also denies feelings of agitation or anger. He denies difficulty eating or sleeping. He acknowledges ongoing confusion, stating that he usually can remember things. He states that he feels safe returning home and that he lives with his girlfriend. He denies questions or concerns on exam.    Psych ROS:  Depression: Patient denies  Anxiety: Patient denies  Mania (lifetime and current): Patient denies  Psychosis: (lifetime and current): Patient denies   Collateral information on 06/15/24:  Per Chesley Holt, [facility]: [facility] and provider spoke with pts significant other, Shonita Charlies [place] to inform her that is psych cleared. The provider went over  medication changes thoroughly with pts SO and advised her to make a follow up appt with pts neurologist in order to answer her questions regarding his medications for dementia. [facility] offered to call and set up her follow up appt. After a lengthy conversation and answering questions with the provider, pts SO, she expressed concern over pt returning home and refused to pick him up.   [facility]  received a secure chat from child psychotherapist indicating that community services were being put in place. Social worker advised pts SO to contact Elspeth at [facility] to receive information and assistance with navigating the memory care placement process. Pts SO was also advised to begin the medicaid application process in order to qualify for placement as well as other support services.    [facility] called [facility] to schedule a follow up appointment for pt. [facility] was informed that the request for a sooner appointment would be brought to pts neurology provider and pts SO would be contacted with an appointment time. [facility] called pts SO to inform her that she will be receiving a call from pts neurology office with an appointment time. [facility] again asked if pts SO would be willing to pick pt up either today or tomorrow but she refused.    Review of Systems  Constitutional: Negative.   Respiratory: Negative.    Cardiovascular: Negative.   Psychiatric/Behavioral:  Positive for memory loss.      Psychiatric and Social History  Psychiatric History:  Information collected from patient's chart and fianc  Prev Dx/Sx: Alzheimer's with aggressive behavior Current Psych Provider: None Home Meds (current): Yes Previous Med Trials: Yes Therapy: None reported  Prior Psych Hospitalization: Denies Prior Self Harm: Denies Prior Violence: Yes  Family Psych History: Denies Family Hx suicide: Denies  Social History:  Educational Hx: Patient graduated high school Occupational Hx: Retired Armed Forces Operational Officer Hx: Denies Living Situation: Lives with fianc of 37 years Spiritual Hx: Yes Access to weapons/lethal means: Denies  Substance History Per chart review, Patient has a past history of alcohol use, but has not drank alcohol in several years per his fiance. Patient also has a past marijuana abuse, but has not used marijuana in several years. Patient has never been admitted to any detox or rehab facility.  Exam  Findings  Physical Exam:  Vital Signs:  Temp:  [97.5 F (36.4 C)-98.4 F (36.9 C)] 97.7 F (36.5 C) (12/10 1433) Pulse Rate:  [55-80] 80 (12/10 1433) Resp:  [17-18] 18 (12/10 1433) BP: (122-140)/(68-88) 140/87 (12/10 1433) SpO2:  [98 %-100 %] 98 % (12/10 1433) Blood pressure (!) 140/87, pulse 80, temperature 97.7 F (36.5 C), temperature source Oral, resp. rate 18, SpO2 98%. There is no height or weight on file to calculate BMI.    Mental Status Exam: General Appearance: Fairly Groomed Calm and cooperative   Orientation:  Oriented to person   Memory:  Immediate;   Poor Recent;   Poor  Concentration:  Concentration: Poor  Recall:  Poor  Attention  Poor  Eye Contact:  Fair  Speech: slow   Language:  Fair  Volume:  Normal  Mood: Euthymic   Affect:  Flat   Thought Process:  Impaired due to dementia and appearsN at baseline   Thought Content:  Denies SI/HI/AVH  Suicidal Thoughts:  No  Homicidal Thoughts:  No  Judgement:  Poor secondary to dementia   Insight:  Poor secondary to dementia  Psychomotor Activity:  normal   Akathisia:  No  Fund of Knowledge:  Poor      Assets:  Housing Social Support  Cognition:  Impaired,  Severe  ADL's:  Impaired  AIMS (if indicated):        Other History   These have been pulled in through the EMR, reviewed, and updated if appropriate.  Family History:  The patient's family history includes Diabetes in his mother.  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   Elevated PSA 08/16/2014   History of adenomatous polyp of colon 10/11/2020   Hyperlipidemia    Hypothyroidism 10/11/2020   Malignant neoplasm of prostate 10/25/2014    Surgical History: Past Surgical History:  Procedure Laterality Date   PROSTATECTOMY        Medications:   Current Facility-Administered Medications:    divalproex  (DEPAKOTE  SPRINKLE) capsule 250 mg, 250 mg, Oral, QHS, Motley-Mangrum, Jadeka A, PMHNP, 250 mg at 06/14/24 2237   hydrOXYzine  (ATARAX ) tablet 25 mg, 25 mg, Oral, TID PRN, Motley-Mangrum, Jadeka A, PMHNP, 25 mg at 06/13/24 2019   QUEtiapine  (SEROQUEL ) tablet 25 mg, 25 mg, Oral, BID BM PRN, Motley-Mangrum, Jadeka A, PMHNP   QUEtiapine  (SEROQUEL ) tablet 50 mg, 50 mg, Oral, QHS, Motley-Mangrum, Jadeka A, PMHNP, 50 mg at 06/14/24 2238   traZODone  (DESYREL ) tablet 50 mg, 50 mg, Oral, Once, Griselda Norris, MD  Current Outpatient Medications:    atorvastatin (LIPITOR) 40 MG tablet, Take 40 mg by mouth every evening., Disp: , Rfl:    brexpiprazole  (REXULTI ) 2 MG TABS tablet, Take 1 tablet (2 mg total) by mouth daily. (Patient taking differently: Take 1 mg by mouth every evening.), Disp: 30 tablet, Rfl: 3   Coenzyme Q10 (COQ-10 PO), Take 1 capsule by mouth every evening., Disp: , Rfl:    donepezil  (ARICEPT ) 23 MG TABS tablet, Take 23 mg by mouth every evening., Disp: , Rfl:    memantine  (NAMENDA ) 10 MG tablet, Take 10 mg by mouth 2 (two) times daily., Disp: , Rfl:    divalproex  (DEPAKOTE ) 125 MG DR tablet, Depakote  mg Take 1 tab at night , may increase to 1 tab twice a day if needed (Patient not taking: Reported on 06/12/2024), Disp: 60 tablet, Rfl: 11  Allergies: Allergies  Allergen Reactions   Sildenafil Other (See Comments)    Unknown     Kishia Shackett, Wyline CROME, NP

## 2024-06-15 NOTE — Progress Notes (Addendum)
 ICM consulted to discuss memory care process. CSW attempted to contact pt's S/O without success. Left HIPAA Compliant voicemail requesting call back.  Addend @ 11:44AM CSW attempted to contact S/o again without success. Will try back shortly.   Addend@1 :17PM CSW spoke with pts S/O to inform of Memory care process. Per [place], pt does not have Medicaid. CSW advised to speak with Medicaid Eligibility caseworker at DSS to inquire about whether pt is eligible for LTC or Special Assistance Medicaid. Based on eligibility, level of care should be determined for LTC at SNF vs Memory Care. Shonita verbalized understanding.   CSW offered referral to [facility] and discussed they can offer home care services in the interim. S/O reports she has concerns about pt returning home given his current medications and behaviors. CSW inquired about whether she'd spoken with psychiatry team- she reports she spoke with Psych NP, Cathaleen, on 12/9 but has not spoken to anyone today.   CSW outreached to ordering provider, Wyline Pizza, NP, to request she contact S/O to discuss medications and behaviors. Awaiting response.   Addend @ 2:33PM NP reported she will outreach to S/O to provide an update.

## 2024-06-15 NOTE — Telephone Encounter (Signed)
Patient currently admitted at [facility]. 

## 2024-06-15 NOTE — ED Notes (Signed)
 Patient sleeping. 1:1 sitter reports no incidents.

## 2024-06-15 NOTE — ED Provider Notes (Signed)
 Emergency Medicine Observation Re-evaluation Note  Devon Romero is a 58 y.o. male, seen on rounds today.  Pt initially presented to the ED for complaints of No chief complaint on file. Currently, the patient is resting  Physical Exam  BP 122/68 (BP Location: Left Arm)   Pulse (!) 55   Temp 98.4 F (36.9 C)   Resp 18   SpO2 100%  Physical Exam General: NAD Cardiac: Well perfused Lungs: Respirations even and unlabored Psych: No distress  ED Course / MDM  EKG:EKG Interpretation Date/Time:  Sunday June 12 2024 04:12:47 EST Ventricular Rate:  60 PR Interval:  166 QRS Duration:  82 QT Interval:  414 QTC Calculation: 414 R Axis:   16  Text Interpretation: Sinus rhythm Minimal ST elevation, anterior leads Confirmed by Griselda Norris [phone] on 06/12/2024 5:28:16 AM  I have reviewed the labs performed to date as well as medications administered while in observation.  Recent changes in the last 24 hours include evaluated by psychiatry, inpatient management recommended.  Plan  Current plan is for inpatient psychiatric management.            Jerrol Agent, MD 06/15/24 [phone]
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
## 2024-06-15 NOTE — ED Notes (Signed)
 [facility] and provider spoke with pts significant other, Shonita Charlies [place] to inform her that is psych cleared. The provider went over medication changes thoroughly with pts SO and advised her to make a follow up appt with pts neurologist in order to answer her questions regarding his medications for dementia. [facility] offered to call and set up her follow up appt. After a lengthy conversation and answering questions with the provider, pts SO, she expressed concern over pt returning home and refused to pick him up.  [facility] received a secure chat from child psychotherapist indicating that community services were being put in place. Social worker advised pts SO to contact Elspeth at [facility] to receive information and assistance with navigating the memory care placement process. Pts SO was also advised to begin the medicaid application process in order to qualify for placement as well as other support services.   [facility] called [facility] to schedule a follow up appointment for pt. [facility] was informed that the request for a sooner appointment would be brought to pts neurology provider and pts SO would be contacted with an appointment time. [facility] called pts SO to inform her that she will be receiving a call from pts neurology office with an appointment time. [facility] again asked if pts SO would be willing to pick pt up either today or tomorrow but she refused.   Chesley Holt, [facility]  06/15/24

## 2024-06-15 NOTE — ED Notes (Addendum)
 [facility] received communication that pts family would like to receive a call from [facility] and the provider to discuss pts current behaviors and medication adjustments that have been made. [facility] called two times and left a HIPAA complaint message to return the call.   Chesley Holt, [facility]   06/15/24

## 2024-06-16 ENCOUNTER — Telehealth: Payer: Self-pay

## 2024-06-16 DIAGNOSIS — G300 Alzheimer's disease with early onset: Secondary | ICD-10-CM | POA: Diagnosis not present

## 2024-06-16 DIAGNOSIS — F0280 Dementia in other diseases classified elsewhere without behavioral disturbance: Secondary | ICD-10-CM

## 2024-06-16 MED ORDER — QUETIAPINE FUMARATE 25 MG PO TABS: 25.0000 mg | ORAL_TABLET | Freq: Three times a day (TID) | ORAL | 1 refills | Status: AC | PRN

## 2024-06-16 MED ORDER — QUETIAPINE FUMARATE 50 MG PO TABS: 25.0000 mg | ORAL_TABLET | Freq: Three times a day (TID) | ORAL | Status: DC | PRN

## 2024-06-16 MED ORDER — MEMANTINE HCL 10 MG PO TABS: 10.0000 mg | ORAL_TABLET | Freq: Two times a day (BID) | ORAL | 1 refills | Status: AC

## 2024-06-16 MED ORDER — DIVALPROEX SODIUM 125 MG PO CSDR: 250.0000 mg | DELAYED_RELEASE_CAPSULE | Freq: Every day | ORAL | 1 refills | Status: DC

## 2024-06-16 MED ORDER — DONEPEZIL HCL 23 MG PO TABS
23.0000 mg | ORAL_TABLET | Freq: Every day | ORAL | Status: DC
  Filled 2024-06-16: qty 1

## 2024-06-16 MED ORDER — QUETIAPINE FUMARATE 50 MG PO TABS: 50.0000 mg | ORAL_TABLET | Freq: Every day | ORAL | 1 refills | Status: AC

## 2024-06-16 MED ORDER — MEMANTINE HCL 5 MG PO TABS
10.0000 mg | ORAL_TABLET | Freq: Two times a day (BID) | ORAL | Status: DC
  Administered 2024-06-16: 10 mg via ORAL
  Filled 2024-06-16: qty 2

## 2024-06-16 MED ORDER — DONEPEZIL HCL 23 MG PO TABS: 23.0000 mg | ORAL_TABLET | Freq: Every day | ORAL | 1 refills | Status: DC

## 2024-06-16 MED ORDER — HYDROXYZINE HCL 25 MG PO TABS: 25.0000 mg | ORAL_TABLET | Freq: Three times a day (TID) | ORAL | 1 refills | Status: AC | PRN

## 2024-06-16 NOTE — ED Provider Notes (Addendum)
 Emergency Medicine Observation Re-evaluation Note  Devon Romero is a 58 y.o. male, seen on rounds today.  Pt initially presented to the ED for complaints of No chief complaint on file. Currently, the patient is awaiting placement.  Physical Exam  BP (!) 126/93 (BP Location: Right Arm)   Pulse 65   Temp 98.3 F (36.8 C) (Oral)   Resp 16   SpO2 100%  Physical Exam General: Awake and alert no acute distress Cardiac: Regular no rub murmur gallop Lungs: No respiratory distress.  Clear to auscultation. Psych: Patient is alert and pleasant at this time.  But clearly confused.  Speech is not situationally appropriate.  ED Course / MDM  EKG:EKG Interpretation Date/Time:  Sunday June 12 2024 04:12:47 EST Ventricular Rate:  60 PR Interval:  166 QRS Duration:  82 QT Interval:  414 QTC Calculation: 414 R Axis:   16  Text Interpretation: Sinus rhythm Minimal ST elevation, anterior leads Confirmed by Griselda Norris [phone] on 06/12/2024 5:28:16 AM  I have reviewed the labs performed to date as well as medications administered while in observation.  Recent changes in the last 24 hours include none.  I discussed the patient with his sitters.  They report he is getting up and walking around and has good mobility.  He does get confused about eating.  At times a prepped food in his hand and he will eat but other times does not seem to understand he needs to eat.  Plan  Current plan is for placement.. Plan has been updated today.  Plan will be for patient to go home with his family member.  Medication prescriptions have been updated and sent for pharmacy per recommendations from behavioral health.   Armenta Canning, MD 06/16/24 1223    Armenta Canning, MD 06/16/24 [phone]

## 2024-06-16 NOTE — Discharge Instructions (Addendum)
 Please follow up with Sonny at [facility].   1.  Take Seroquel  50 mg at bedtime.  You may give 25 mg up to 3 times daily, every 8 hours, if needed for agitation. 2.  Continue to take Aricept  and Namenda  as prescribed. 3.  If additional medication is needed for agitation or anxiety you may try hydroxyzine  (Atarax  1 tablet every 8 hours. 4.  Is very important that you make all of your follow-up appointments with your primary care providers, behavioral health and counselors.
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
## 2024-06-16 NOTE — Telephone Encounter (Signed)
 Camie called the behavior health provider and spoke to them

## 2024-06-16 NOTE — Progress Notes (Addendum)
 Care meeting held with S/O, this clinical research associate, ED Director, Behavioral Health Coordinator, Medical Director, ICM supervisor, and Administrator.   Care team discussed resources and Executive Director to escalate to [facility]rology re: sooner appt than 3/11 @ 11:30AM and [facility] leadership team to obtain SW/CM contact information to provide to [employer]. [facility] to outreach to Psych NP to discuss potential PRN meds and coordinate  prescriptions needed for discharge. Devon Romero committed to contacting pt's PCP (Dr. [facility]) as soon as possible for follow up appt.   CSW attached DSS and [facility] resources to AVS. [name] is in agreement with discharge given resources currently in place and will pick pt up at 4PM.   APS SW [facility] notified of the above information and will assist with Medicaid application.   Address: [address] [place], [place] [address]

## 2024-06-16 NOTE — ED Notes (Signed)
 [facility] called pts SO, Shonita Taylor to inform her that 25mg  of Seroquel  up to 3Xs daily as needed has been added to pts home medications and is being sent along with pts other medications to their preferred pharmacy. Pts SO was appreciative of the information.   Chesley Holt, [facility]  06/16/24

## 2024-06-17 ENCOUNTER — Telehealth: Payer: Self-pay | Admitting: *Deleted

## 2024-06-17 NOTE — Progress Notes (Signed)
 Complex Care Management Note Care Guide Note  06/17/2024 Name: DEROY NOAH MRN: [id number] DOB: Jan 15, 1966   Complex Care Management Outreach Attempts: An unsuccessful telephone outreach was attempted today to offer the patient information about available complex care management services.  Follow Up Plan:  Additional outreach attempts will be made to offer the patient complex care management information and services.   Encounter Outcome:  No Answer [facility] HealthPopulation Health Care Guide  Direct Dial:[phone] [phone] Website: [email or web address]

## 2024-06-21 ENCOUNTER — Telehealth: Payer: Self-pay | Admitting: Physician Assistant

## 2024-06-21 DIAGNOSIS — F0280 Dementia in other diseases classified elsewhere without behavioral disturbance: Secondary | ICD-10-CM | POA: Diagnosis not present

## 2024-06-21 DIAGNOSIS — G300 Alzheimer's disease with early onset: Secondary | ICD-10-CM

## 2024-06-21 MED ORDER — DIVALPROEX SODIUM 250 MG PO DR TAB: 250.0000 mg | DELAYED_RELEASE_TABLET | Freq: Two times a day (BID) | ORAL | 3 refills | Status: AC

## 2024-06-21 MED ORDER — DIVALPROEX SODIUM 125 MG PO CSDR: 250.0000 mg | DELAYED_RELEASE_CAPSULE | Freq: Two times a day (BID) | ORAL | 3 refills | Status: DC

## 2024-06-21 NOTE — Progress Notes (Signed)
 Virtual Visit via Video Note The purpose of this virtual visit is to provide medical care in a patient that is unable to be seen in person due to physical or health limitations   Consent was obtained for video visit:  yes  Answered questions that patient had about telehealth interaction:  yes I discussed the limitations, risks, security and privacy concerns of performing an evaluation and management service by telemedicine. I also discussed with the patient that there may be a patient responsible charge related to this service. The patient expressed understanding and agreed to proceed.  Pt location: Home Physician Location: office Name of referring provider:  Auston Opal, DO I connected with Devon Romero at patients initiation/request on 06/21/2024 at  1:00 PM EST by video enabled telemedicine application and verified that I am speaking with the correct person using two identifiers. Pt MRN:  [id number] Pt DOB:  March 13, 1966 Video Participants:  Devon Romero;  Dola Maywood     Assessment and Plan:    Devon Romero  is a 58 y.o. RH male with a history of  hyperlipidemia, hypertension, hypothyroidism, history of prostate cancer s/p prostatectomy and a history of dementia due to Alzheimer's disease, early onset per neuropsych evaluation and positive lumbar puncture. The patient has been seen at [facility] neurology not deeming him a candidate for agents like lecanemab or Donanemab do to the advanced stage of the disease.  Patient is currently on memantine  20 mg daily per fiancee's preference, and donepezil  23 mg nightly. He is  seen today prior to his scheduled visit per fiancee's request. Patient has been seen in person on 05/18/24 at which time there was noticeable cognitive decline, needing assistance with most ADLS, 24/7 care i.e HHN versus memory care. Medications for mood, including Rexulti  have not been therapeutic. He had adverse reaction to Rexulti  with increased aggressiveness  and agitation, seen by psychiatry, currently on Seroquel  50 mg bid and Depakote  250 mg bid with some control of his mood.  Donepezil  discontinued due to potential bradycardia and interaction with Seroquel . Memantine  to be continued. Behavioral symptoms include pacing, fist pounding, and wandering, will place referral to psychiatry for medication management. Neurology has reached the limit of its interventions.  - Continue Seroquel  50 mg twice daily. side effects discussed - Discontinued donepezil  due to potential bradycardia and interaction with Seroquel . - Continue memantine  20 mg daily.side effects discussed  - Changed Depakote  250 mg bid to capsules as preferred by him. - Consider palliative care for symptom management and quality of life. - Discuss with social worker options for home health nursing or memory care facility.   History of Present Illness:   Discussed the use of AI scribe software for clinical note transcription with the patient, who gave verbal consent to proceed.  History of Present Illness  Devon Romero is a 58 year old male with Alzheimer's disease who presents with worsening agitation and memory decline.  He has been experiencing increased aggression and agitation, particularly during the evening, described as 'sundowning'. The caregiver noted that the prescribed medication was not effective, leading to a change in the Seroquel  dosage to 50 mg twice a day, which resulted in reduced agitation. He was previously on Rexulti , which was discontinued. He is currently taking Seroquel  50 mg twice a day, Depakote  sprinkles 125 mg twice a day, and memantine  210 mg twice a day. The caregiver prefers the pill form of Depakote  as he has difficulty with the capsule form.  His  memory has significantly declined. He does not recognize familiar people or his home, requiring guidance for basic navigation within the house. He has lost interest in activities he previously enjoyed,  such as watching sports, and now shadows his caregiver throughout the day.  He does not recognize himself in the mirror and attempts to pick up non-existent objects from the floor. He needs help with dressing to avoid wearing clothes incorrectly.  He exhibits wandering tendencies, having left the house unsupervised, which led to a police search. He also misplaces personal items frequently. His fiancee has attempted to use an AirTag for tracking, but he often removes it.  He requires assistance with daily activities, including dressing and medication management. He eats small bites throughout the day rather than full meals. He is sometimes incontinent of urine but not of stool.     History on Initial Assessment 11/23/2019: This is a pleasant 58 year old right-handed man with a history of hyperlipidemia presenting for evaluation of memory loss. His fiancee Devon Romero is present to provide additional information. They have lived together for 25 years. He reports memory changes started around 2 years ago. He was laid off work in 2016 and did not work for 2 years. When he came back to work in 2018 in [employer], he kept asking things repeatedly. He used to have a keen memory, however now co-workers were telling him he was repeating questions. He states memory issues did not affect his job, he was working there until he was furloughed in 2020. Devon Romero started noticing minor forgetfulness around that time, however in the past year, symptoms have become more noticeable. He continues to ask the same questions repeatedly. He would not remember something that had occurred, for instance he did not remember leaving the stove on this morning, which was the first time he had done this. He miss a bill last month, he has been forgetting passwords, and found a past due bill because it was not set up on autopay. She states he is usually good with money so this has never happened in the past, he has great credit. He has  gotten lost driving a couple of months ago, then a couple of weeks ago he had an appointment to get his taxes done and did not know where to exit. He is only on Lipitor on a regular basis and denies missing medication. He misplaces things at home, which is not new. No family history of dementia. He denies any alcohol use. He had a concussion at age 15 on a tubing trip where he had amnesia for a day. He appears quiet and slightly withdrawn in the office, however they both report that his mood is good, I'm happy. No paranoia or hallucinations. Devon Romero reports that he has calmed down and has become more tolerant this past year, he does not get as upset when she tells him he asked something already. Sleep is good, she denies any REM behavior disorder. He denies any headaches, dizziness, vision changes, dysarthria/dysphagia, neck/back pain, focal numbness/tingling/weakness, bowel/bladder dysfunction, anosmia, or tremors. No falls.    Neuropsych Evaluation, Dr. Richie 06/24/2022 briefly, results suggested diffuse cognitive dysfunction. He performed well relative to age-matched peers across phonemic fluency and an isolated task assessing abstract reasoning. However, all other assessed domains exhibited severe impairment. Relative to his previous evaluation in May 2021, significant performance declines were observed across processing speed, attention/concentration, cognitive flexibility, semantic fluency, and confrontation naming. A more modest decline was noted across visuospatial abilities. Memory  performances were stable across the two evaluations in that they both exhibited severe impairments with amnestic patterns and poor recognition performances. Regarding etiology, I agree with prior concerns raised by Dr. Jackquline surrounding early-onset Alzheimer's disease. Neuroimaging suggesting advanced temporal lobe atrophy, as well as his lumbar puncture suggesting pathological changes consistent with this illness, are  certainly very worrisome. Across memory tasks, he did not benefit from repeated exposure to information, was essentially amnestic after brief delays, and performed poorly across yes/no recognition trials. Taken together, this suggests rapid forgetting and a prominent memory storage impairment, both of which are hallmark characteristics of this illness. Progressive decline is another very worrisome feature of his current clinical presentation. While certainly a very rare presentation given his overall age, this continues to appear the best explanation for ongoing cognitive and functional decline. Continued medical monitoring will be important moving forward.        Observations/Objective:   There were no vitals filed for this visit. GEN:  The patient appears stated age and is in NAD.  Neurological examination: Patient is awake, alert, not oriented to person, not to place or date . No aphasia or dysarthria.HE does not may eye contact, speech unintelligible. Decreased  comprehension. Remote and recent memory impaired. Unable to repeat phrases. Cranial nerves: Extraocular movements intact with no nystagmus. No facial asymmetry. Motor: moves all extremities symmetrically, at least anti-gravity x 4.  He has incoordination on finger to nose testing . Gait: mildly wider based and steady,  short stride, less arm swing.     Total time spent on today's visit was 30 minutes, including both face-to-face time and nonface-to-face time.  Time included that spent on review of records (prior notes available to me/labs/imaging if pertinent), discussing treatment and goals, answering patient's questions and coordinating care.   Camie Sevin, PA-C

## 2024-06-22 NOTE — Addendum Note (Signed)
 Addended by: DINA CREDIT E on: 06/22/2024 05:27 AM   Modules accepted: Level of Service

## 2024-06-23 ENCOUNTER — Telehealth: Payer: Self-pay | Admitting: *Deleted

## 2024-11-14 ENCOUNTER — Ambulatory Visit: Admitting: Physician Assistant
